# Patient Record
Sex: Female | Born: 1965
Health system: Southern US, Community
[De-identification: ages and names within clinical notes are randomized; demographics above are authoritative.]

## PROBLEM LIST (undated history)

## (undated) DIAGNOSIS — M542 Cervicalgia: Secondary | ICD-10-CM

## (undated) DIAGNOSIS — F419 Anxiety disorder, unspecified: Secondary | ICD-10-CM

## (undated) DIAGNOSIS — N809 Endometriosis, unspecified: Secondary | ICD-10-CM

## (undated) DIAGNOSIS — F32A Depression, unspecified: Secondary | ICD-10-CM

## (undated) DIAGNOSIS — R011 Cardiac murmur, unspecified: Secondary | ICD-10-CM

## (undated) HISTORY — DX: Depression, unspecified: F32.A

## (undated) HISTORY — PX: CERVICAL FUSION: SHX112

## (undated) HISTORY — PX: SURGERY OF LIP: SUR1315

## (undated) HISTORY — DX: Cardiac murmur, unspecified: R01.1

## (undated) HISTORY — DX: Anxiety disorder, unspecified: F41.9

## (undated) HISTORY — DX: Cervicalgia: M54.2

## (undated) HISTORY — DX: Endometriosis, unspecified: N80.9

---

## 2007-05-04 HISTORY — PX: PELVIC LAPAROSCOPY: SHX162

## 2011-05-04 HISTORY — PX: ABDOMINAL HYSTERECTOMY: SHX81

## 2013-05-03 HISTORY — PX: BARTHOLIN CYST MARSUPIALIZATION: SHX5383

## 2017-04-12 DIAGNOSIS — G441 Vascular headache, not elsewhere classified: Secondary | ICD-10-CM | POA: Diagnosis not present

## 2017-04-12 DIAGNOSIS — M5481 Occipital neuralgia: Secondary | ICD-10-CM | POA: Diagnosis not present

## 2017-04-12 DIAGNOSIS — R9082 White matter disease, unspecified: Secondary | ICD-10-CM | POA: Diagnosis not present

## 2017-04-12 DIAGNOSIS — Z981 Arthrodesis status: Secondary | ICD-10-CM | POA: Diagnosis not present

## 2017-04-12 DIAGNOSIS — M47812 Spondylosis without myelopathy or radiculopathy, cervical region: Secondary | ICD-10-CM | POA: Diagnosis not present

## 2017-04-12 DIAGNOSIS — R51 Headache: Secondary | ICD-10-CM | POA: Diagnosis not present

## 2017-04-12 DIAGNOSIS — G939 Disorder of brain, unspecified: Secondary | ICD-10-CM | POA: Diagnosis not present

## 2017-04-12 DIAGNOSIS — R42 Dizziness and giddiness: Secondary | ICD-10-CM | POA: Diagnosis not present

## 2017-05-02 MED FILL — GABAPENTIN 300 MG CAPSULE: 300 | 30 days supply | Qty: 120 | Fill #0

## 2017-06-10 MED FILL — GABAPENTIN 300 MG CAPSULE: 300 | 30 days supply | Qty: 120 | Fill #1

## 2017-07-05 MED FILL — ESTRADIOL 1 MG TABLET: 1 | 30 days supply | Qty: 30 | Fill #0

## 2017-07-15 MED FILL — GABAPENTIN 300 MG CAPSULE: 300 | 30 days supply | Qty: 120 | Fill #2

## 2017-08-11 ENCOUNTER — Ambulatory Visit (INDEPENDENT_AMBULATORY_CARE_PROVIDER_SITE_OTHER): Payer: 59 | Admitting: Gynecology

## 2017-08-11 ENCOUNTER — Encounter: Payer: Self-pay | Admitting: Gynecology

## 2017-08-11 VITALS — BP 120/74 | Ht 59.0 in | Wt 102.0 lb

## 2017-08-11 DIAGNOSIS — Z79890 Hormone replacement therapy (postmenopausal): Secondary | ICD-10-CM | POA: Diagnosis not present

## 2017-08-11 DIAGNOSIS — Z1321 Encounter for screening for nutritional disorder: Secondary | ICD-10-CM | POA: Diagnosis not present

## 2017-08-11 DIAGNOSIS — Z1329 Encounter for screening for other suspected endocrine disorder: Secondary | ICD-10-CM | POA: Diagnosis not present

## 2017-08-11 DIAGNOSIS — N952 Postmenopausal atrophic vaginitis: Secondary | ICD-10-CM

## 2017-08-11 DIAGNOSIS — Z13220 Encounter for screening for lipoid disorders: Secondary | ICD-10-CM

## 2017-08-11 DIAGNOSIS — Z01411 Encounter for gynecological examination (general) (routine) with abnormal findings: Secondary | ICD-10-CM

## 2017-08-11 MED ORDER — ESTRADIOL 1 MG PO TABS: 1.0000 mg | ORAL_TABLET | Freq: Every day | ORAL | 4 refills | Status: DC

## 2017-08-11 MED FILL — ESTRADIOL 1 MG TABLET: 1 | 90 days supply | Qty: 90 | Fill #0

## 2017-08-11 NOTE — Progress Notes (Signed)
    Council Mechanicracy Pulis 01-09-1966 [id number]        52 y.o.  G2P2 new patient for annual gynecologic exam.  History of endometriosis status post TAH/BSO on estradiol 1 mg daily doing well with this.  No gynecologic complaints.  Past medical history,surgical history, problem list, medications, allergies, family history and social history were all reviewed and documented as reviewed in the EPIC chart.  ROS:  Performed with pertinent positives and negatives included in the history, assessment and plan.   Additional significant findings : None   Exam: Kennon PortelaKim Gardner assistant Vitals:   08/11/17 0923  BP: 120/74  Weight: 102 lb (46.3 kg)  Height: 4\' 11"  (1.499 m)   Body mass index is 20.6 kg/m.  General appearance:  Normal affect, orientation and appearance. Skin: Grossly normal HEENT: Without gross lesions.  No cervical or supraclavicular adenopathy. Thyroid normal.  Lungs:  Clear without wheezing, rales or rhonchi Cardiac: RR, without RMG Abdominal:  Soft, nontender, without masses, guarding, rebound, organomegaly or hernia Breasts:  Examined lying and sitting without masses, retractions, discharge or axillary adenopathy. Pelvic:  Ext, BUS, Vagina: Normal with mild atrophic changes.  Adnexa: Without masses or tenderness    Anus and perineum: Normal   Rectovaginal: Normal sphincter tone without palpated masses or tenderness.    Assessment/Plan:  52 y.o. G2P2 female for annual gynecologic exam.  Status post TAH/BSO for endometriosis  1. Postmenopausal/HRT.  Is on Estrace 1 mg daily.  Has been on this since her hysterectomy.  We reviewed risks versus benefits of HRT to include the risks of thrombosis such as stroke heart attack DVT in the breast cancer issue.  Benefits to include symptom relief as well as cardiovascular and bone health.  At this point the patient wants to continue and I refilled her times 1 year. 2. Mammography overdue and I reminded patient she needs to schedule this.   Breast exam normal today. 3. Colonoscopy never.  I reminded patient she needs to schedule a baseline colonoscopy.  Second most common cancer in women.  Names and numbers provided. 4. Pap smear reported 2018.  I do not have copies of any of her Pap smears.  Pap smear of vaginal cuff done today.  No history of abnormal Pap smears.  I reviewed current screening guidelines and options of stop screening versus less frequent screening intervals reviewed.  Will readdress on annual basis. 5. DEXA never.  Will plan further into the menopause. 6. Health maintenance.  Future orders for fasting CBC, CMP, lipid profile, vitamin D and TSH placed.  Does have a history of recurrent UTIs in the past.  Check baseline urine analysis today.  Follow-up in 1 year, sooner as needed.   Dara Lordsimothy P Brinsley Wence MD, 9:47 AM 08/11/2017

## 2017-08-11 NOTE — Patient Instructions (Signed)
Call to Schedule your mammogram  [facility]. [facility], [address] Phone: [phone]   Schedule your colonoscopy   [facility]   Address: [address], [place], [place] [address]  [phone]      

## 2017-08-11 NOTE — Addendum Note (Signed)
Addended by: Dayna BarkerGARDNER, Demiya Magno K on: 08/11/2017 10:49 AM   Modules accepted: Orders

## 2017-08-12 LAB — PAP IG W/ RFLX HPV ASCU

## 2017-08-15 ENCOUNTER — Other Ambulatory Visit: Payer: Self-pay | Admitting: *Deleted

## 2017-08-15 MED ORDER — SULFAMETHOXAZOLE-TRIMETHOPRIM 800-160 MG PO TABS: 1.0000 | ORAL_TABLET | Freq: Two times a day (BID) | ORAL | 0 refills | Status: DC

## 2017-08-15 MED ORDER — METRONIDAZOLE 500 MG PO TABS: 500.0000 mg | ORAL_TABLET | Freq: Two times a day (BID) | ORAL | 0 refills | Status: DC

## 2017-08-15 MED FILL — SULFAMETHOXAZOLE-TMP DS TAB: 800-160 | 3 days supply | Qty: 6 | Fill #0

## 2017-08-15 MED FILL — metroNIDAZOLE 500 MG TABS: 500 | 7 days supply | Qty: 14 | Fill #0

## 2017-08-22 ENCOUNTER — Ambulatory Visit: Payer: 59 | Admitting: Gynecology

## 2017-08-22 ENCOUNTER — Encounter: Payer: Self-pay | Admitting: Gynecology

## 2017-08-22 ENCOUNTER — Telehealth: Payer: Self-pay | Admitting: *Deleted

## 2017-08-22 VITALS — BP 118/76

## 2017-08-22 DIAGNOSIS — N898 Other specified noninflammatory disorders of vagina: Secondary | ICD-10-CM | POA: Diagnosis not present

## 2017-08-22 DIAGNOSIS — M545 Low back pain: Secondary | ICD-10-CM

## 2017-08-22 DIAGNOSIS — Z113 Encounter for screening for infections with a predominantly sexual mode of transmission: Secondary | ICD-10-CM

## 2017-08-22 LAB — WET PREP FOR TRICH, YEAST, CLUE

## 2017-08-22 MED FILL — GABAPENTIN 300 MG CAPSULE: 300 | 30 days supply | Qty: 120 | Fill #3

## 2017-08-22 NOTE — Telephone Encounter (Signed)
Recommend clean-catch urine analysis with culture.  Her last urine culture grew out a bacteria that was resistant to a number of antibiotics she does have a UTI then we need to know which antibiotics would work best.  Low back pain can be musculoskeletal and not related to UTI.

## 2017-08-22 NOTE — Patient Instructions (Signed)
Office will call with the urine culture results.

## 2017-08-22 NOTE — Telephone Encounter (Signed)
I called pt and spoke with her about this and she didn't want to wait until the culture came back, states she would like to be treated today, transferred to front desk to schedule.

## 2017-08-22 NOTE — Telephone Encounter (Signed)
Pt was prescribed septra ds twice daily on 08/15/17 completed Rx and has lower back discomfort, no other symptoms such as frequent urination , burning with urination etc. Pt said she took a UTI Azo kit which confirmed UTI. Please advise

## 2017-08-22 NOTE — Progress Notes (Signed)
    Council Mechanicracy Myren [birth year] [id number]        52 y.o.  G2P2 presents having recently been seen for her annual exam where her urine grew out E. coli and she was treated with Septra DS 1 p.o. twice daily times 3 days.  Her Pap smear showed normal cytology but did show trichomonas.  She was asymptomatic at the time.  She notes over the last several days she has developed some low back pain and wonders whether she is having recurrence of her UTI.  No frequency dysuria urgency or chills.  Also notes a day or 2 of slight vaginal discharge.  No odor itching or irritation.  Past medical history,surgical history, problem list, medications, allergies, family history and social history were all reviewed and documented in the EPIC chart.  Directed ROS with pertinent positives and negatives documented in the history of present illness/assessment and plan.  Exam: Kennon PortelaKim Gardner assistant Vitals:   08/22/17 1536  BP: 118/76   General appearance:  Normal Spine straight without CVA tenderness Abdomen soft nontender without masses guarding rebound Pelvic external BUS vagina with yellow to whitish discharge.  Bimanual without masses or tenderness.  Assessment/Plan:  52 y.o. G2P2 with above history and exam.  Urine analysis shows no white cells or red cells.  10-20 squamous cells moderate bacteria.  I discussed the contaminant nature of the urine analysis and will await culture before treating given her isolated symptoms of low back pain.  I also reviewed the trichomonas diagnoses on Pap smear.  She relates STDs would not be possible.  She took Flagyl 500 mg twice daily and is just finishing the course of antibiotics.  Wet prep today is negative.  I did a GC/Chlamydia screen for completeness with her acknowledgment.  Dara Lordsimothy P Hicks Feick MD, 3:45 PM 08/22/2017

## 2017-08-23 LAB — C. TRACHOMATIS/N. GONORRHOEAE RNA
C. trachomatis RNA, TMA: NOT DETECTED
N. gonorrhoeae RNA, TMA: NOT DETECTED

## 2017-08-24 LAB — URINALYSIS, COMPLETE W/RFL CULTURE
Bilirubin Urine: NEGATIVE
Glucose, UA: NEGATIVE
Hyaline Cast: NONE SEEN /LPF
Leukocyte Esterase: NEGATIVE
Nitrites, Initial: NEGATIVE
Protein, ur: NEGATIVE
Specific Gravity, Urine: 1.025 (ref 1.001–1.03)
pH: 5.5 (ref 5.0–8.0)

## 2017-08-24 LAB — CULTURE INDICATED

## 2017-08-24 LAB — URINE CULTURE
MICRO NUMBER:: 90494682
SPECIMEN QUALITY:: ADEQUATE

## 2017-09-05 ENCOUNTER — Encounter: Payer: Self-pay | Admitting: Gynecology

## 2017-09-05 LAB — URINALYSIS, COMPLETE W/RFL CULTURE
Bilirubin Urine: NEGATIVE
Glucose, UA: NEGATIVE
Hgb urine dipstick: NEGATIVE
Hyaline Cast: NONE SEEN /LPF
Ketones, ur: NEGATIVE
Nitrites, Initial: POSITIVE — AB
Protein, ur: NEGATIVE
Specific Gravity, Urine: 1.021 (ref 1.001–1.03)
pH: 6 (ref 5.0–8.0)

## 2017-09-05 LAB — URINE CULTURE
MICRO NUMBER:: 90447987
SPECIMEN QUALITY:: ADEQUATE

## 2017-09-05 LAB — CULTURE INDICATED

## 2017-10-03 MED FILL — GABAPENTIN 300 MG CAPSULE: 300 | 30 days supply | Qty: 120 | Fill #0

## 2017-11-08 MED FILL — ESTRADIOL 1 MG TABLET: 1 | 90 days supply | Qty: 90 | Fill #1

## 2017-11-08 MED FILL — GABAPENTIN 300 MG CAPSULE: 300 | 30 days supply | Qty: 120 | Fill #0

## 2017-12-15 MED FILL — GABAPENTIN 300 MG CAPSULE: 300 | 30 days supply | Qty: 120 | Fill #1

## 2018-01-24 MED FILL — GABAPENTIN 300 MG CAPSULE: 300 | 30 days supply | Qty: 120 | Fill #2

## 2018-02-08 MED FILL — ESTRADIOL 1 MG TABLET: 1 | 90 days supply | Qty: 90 | Fill #2

## 2018-03-02 MED FILL — GABAPENTIN 300 MG CAPSULE: 300 | 30 days supply | Qty: 120 | Fill #3

## 2018-04-07 MED FILL — GABAPENTIN 300 MG CAPSULE: 300 | 30 days supply | Qty: 120 | Fill #4

## 2018-04-10 DIAGNOSIS — M5481 Occipital neuralgia: Secondary | ICD-10-CM | POA: Diagnosis not present

## 2018-04-10 DIAGNOSIS — G939 Disorder of brain, unspecified: Secondary | ICD-10-CM | POA: Diagnosis not present

## 2018-04-10 DIAGNOSIS — G441 Vascular headache, not elsewhere classified: Secondary | ICD-10-CM | POA: Diagnosis not present

## 2018-04-11 ENCOUNTER — Other Ambulatory Visit: Payer: Self-pay | Admitting: Family

## 2018-04-11 DIAGNOSIS — R222 Localized swelling, mass and lump, trunk: Secondary | ICD-10-CM

## 2018-04-12 ENCOUNTER — Other Ambulatory Visit (HOSPITAL_COMMUNITY): Payer: Self-pay | Admitting: Neurology

## 2018-04-12 DIAGNOSIS — M5481 Occipital neuralgia: Secondary | ICD-10-CM

## 2018-04-24 ENCOUNTER — Ambulatory Visit (HOSPITAL_COMMUNITY)
Admission: RE | Admit: 2018-04-24 | Discharge: 2018-04-24 | Disposition: A | Discharge status: Home / Self Care | Payer: 59 | Source: Ambulatory Visit | Attending: Neurology | Admitting: Neurology

## 2018-04-24 DIAGNOSIS — M47813 Spondylosis without myelopathy or radiculopathy, cervicothoracic region: Secondary | ICD-10-CM | POA: Diagnosis not present

## 2018-04-24 DIAGNOSIS — M5481 Occipital neuralgia: Secondary | ICD-10-CM | POA: Insufficient documentation

## 2018-04-24 DIAGNOSIS — M47892 Other spondylosis, cervical region: Secondary | ICD-10-CM | POA: Insufficient documentation

## 2018-04-24 DIAGNOSIS — M47893 Other spondylosis, cervicothoracic region: Secondary | ICD-10-CM | POA: Insufficient documentation

## 2018-05-10 MED FILL — GABAPENTIN 300 MG CAPSULE: 300 | 30 days supply | Qty: 120 | Fill #5

## 2018-05-10 MED FILL — ESTRADIOL 1 MG TABLET: 1 | 90 days supply | Qty: 90 | Fill #3

## 2018-06-15 MED FILL — GABAPENTIN 300 MG CAPSULE: 300 | 30 days supply | Qty: 180 | Fill #0

## 2018-07-21 MED FILL — GABAPENTIN 300 MG CAPSULE: 300 | 30 days supply | Qty: 180 | Fill #1

## 2018-07-21 MED FILL — ESTRADIOL 1 MG TABS: 1 | 90 days supply | Qty: 90 | Fill #4

## 2018-08-10 ENCOUNTER — Other Ambulatory Visit: Payer: Self-pay

## 2018-08-15 ENCOUNTER — Ambulatory Visit (INDEPENDENT_AMBULATORY_CARE_PROVIDER_SITE_OTHER): Payer: 59 | Admitting: Gynecology

## 2018-08-15 ENCOUNTER — Other Ambulatory Visit: Payer: Self-pay

## 2018-08-15 ENCOUNTER — Encounter: Payer: Self-pay | Admitting: Gynecology

## 2018-08-15 VITALS — BP 124/76 | Ht 59.0 in | Wt 109.0 lb

## 2018-08-15 DIAGNOSIS — Z79890 Hormone replacement therapy (postmenopausal): Secondary | ICD-10-CM

## 2018-08-15 DIAGNOSIS — Z1329 Encounter for screening for other suspected endocrine disorder: Secondary | ICD-10-CM

## 2018-08-15 DIAGNOSIS — Z1321 Encounter for screening for nutritional disorder: Secondary | ICD-10-CM

## 2018-08-15 DIAGNOSIS — Z13220 Encounter for screening for lipoid disorders: Secondary | ICD-10-CM

## 2018-08-15 DIAGNOSIS — Z01419 Encounter for gynecological examination (general) (routine) without abnormal findings: Secondary | ICD-10-CM | POA: Diagnosis not present

## 2018-08-15 MED ORDER — ESTRADIOL 1 MG PO TABS: 1.0000 mg | ORAL_TABLET | Freq: Every day | ORAL | 4 refills | Status: DC

## 2018-08-15 NOTE — Progress Notes (Signed)
    Tonya Ramirez [birth year] [id number]        53 y.o.  G2P2 for annual gynecologic exam.  Without gynecologic complaints  Past medical history,surgical history, problem list, medications, allergies, family history and social history were all reviewed and documented as reviewed in the EPIC chart.  ROS:  Performed with pertinent positives and negatives included in the history, assessment and plan.   Additional significant findings : None   Exam: Kennon Portela assistant Vitals:   08/15/18 0914  BP: 124/76  Weight: 109 lb (49.4 kg)  Height: 4\' 11"  (1.499 m)   Body mass index is 22.02 kg/m.  General appearance:  Normal affect, orientation and appearance. Skin: Grossly normal HEENT: Without gross lesions.  No cervical or supraclavicular adenopathy. Thyroid normal.  Lungs:  Clear without wheezing, rales or rhonchi Cardiac: RR, without RMG Abdominal:  Soft, nontender, without masses, guarding, rebound, organomegaly or hernia Breasts:  Examined lying and sitting without masses, retractions, discharge or axillary adenopathy. Pelvic:  Ext, BUS, Vagina: Normal with mild atrophic changes  Adnexa: Without masses or tenderness    Anus and perineum: Normal   Rectovaginal: Normal sphincter tone without palpated masses or tenderness.    Assessment/Plan:  53 y.o. G2P2 female for annual gynecologic exam.  That is post TAH/BSO for endometriosis  1. HRT.  Continues on estradiol 1 mg daily.  Doing well with this and wants to continue.  We again reviewed the risks to include thrombosis and possible breast cancer issues.  Refill x1 year provided. 2. Mammography overdue and I reminded her to schedule this when the coronavirus restrictions are lifted.  Breast exam normal today. 3. Colonoscopy never.  I recommended screening colonoscopy after the coronavirus restrictions are lifted and she agrees to arrange. 4. Pap smear 2019.  No Pap smear done today.  No history of abnormal Pap smears.  We again  discussed options to stop screening per current screening guidelines based on hysterectomy history. 5. DEXA never.  Will plan further into the menopause. 6. Health maintenance.  CBC, CMP, lipid profile, vitamin D and TSH ordered today.  Follow-up 1 year, sooner as needed   Dara Lords MD, 9:51 AM 08/15/2018

## 2018-08-15 NOTE — Patient Instructions (Signed)
Follow-up in 1 year for annual exam.  Schedule your mammogram when you are able.  Schedule your screening colonoscopy.

## 2018-08-16 ENCOUNTER — Other Ambulatory Visit: Payer: Self-pay | Admitting: *Deleted

## 2018-08-16 DIAGNOSIS — E789 Disorder of lipoprotein metabolism, unspecified: Secondary | ICD-10-CM

## 2018-08-16 DIAGNOSIS — E875 Hyperkalemia: Secondary | ICD-10-CM

## 2018-08-16 LAB — COMPREHENSIVE METABOLIC PANEL
AG Ratio: 1.6 (calc) (ref 1.0–2.5)
ALT: 13 U/L (ref 6–29)
AST: 16 U/L (ref 10–35)
Albumin: 4 g/dL (ref 3.6–5.1)
Alkaline phosphatase (APISO): 67 U/L (ref 37–153)
BUN: 19 mg/dL (ref 7–25)
CO2: 31 mmol/L (ref 20–32)
Calcium: 8.8 mg/dL (ref 8.6–10.4)
Chloride: 104 mmol/L (ref 98–110)
Creat: 0.78 mg/dL (ref 0.50–1.05)
Globulin: 2.5 g/dL (calc) (ref 1.9–3.7)
Glucose, Bld: 98 mg/dL (ref 65–99)
Potassium: 3.3 mmol/L — ABNORMAL LOW (ref 3.5–5.3)
Sodium: 141 mmol/L (ref 135–146)
Total Bilirubin: 0.3 mg/dL (ref 0.2–1.2)
Total Protein: 6.5 g/dL (ref 6.1–8.1)

## 2018-08-16 LAB — CBC WITH DIFFERENTIAL/PLATELET
Absolute Monocytes: 270 cells/uL (ref 200–950)
Basophils Absolute: 42 cells/uL (ref 0–200)
Basophils Relative: 0.8 %
Eosinophils Absolute: 198 cells/uL (ref 15–500)
Eosinophils Relative: 3.8 %
HCT: 36 % (ref 35.0–45.0)
Hemoglobin: 12.2 g/dL (ref 11.7–15.5)
Lymphs Abs: 1732 cells/uL (ref 850–3900)
MCH: 29.5 pg (ref 27.0–33.0)
MCHC: 33.9 g/dL (ref 32.0–36.0)
MCV: 87 fL (ref 80.0–100.0)
MPV: 10.9 fL (ref 7.5–12.5)
Monocytes Relative: 5.2 %
Neutro Abs: 2959 cells/uL (ref 1500–7800)
Neutrophils Relative %: 56.9 %
Platelets: 271 10*3/uL (ref 140–400)
RBC: 4.14 10*6/uL (ref 3.80–5.10)
RDW: 12.2 % (ref 11.0–15.0)
Total Lymphocyte: 33.3 %
WBC: 5.2 10*3/uL (ref 3.8–10.8)

## 2018-08-16 LAB — LIPID PANEL
Cholesterol: 185 mg/dL (ref <200)
HDL: 60 mg/dL (ref >=50)
LDL Cholesterol (Calc): 90 mg/dL (calc)
Non-HDL Cholesterol (Calc): 125 mg/dL (calc) (ref <130)
Total CHOL/HDL Ratio: 3.1 (calc) (ref <5.0)
Triglycerides: 264 mg/dL — ABNORMAL HIGH (ref <150)

## 2018-08-16 LAB — VITAMIN D 25 HYDROXY (VIT D DEFICIENCY, FRACTURES): Vit D, 25-Hydroxy: 38 ng/mL (ref 30–100)

## 2018-08-16 LAB — TSH: TSH: 3.5 mIU/L

## 2018-08-17 ENCOUNTER — Other Ambulatory Visit: Payer: 59

## 2018-08-17 ENCOUNTER — Other Ambulatory Visit: Payer: Self-pay

## 2018-08-17 DIAGNOSIS — E789 Disorder of lipoprotein metabolism, unspecified: Secondary | ICD-10-CM | POA: Diagnosis not present

## 2018-08-17 DIAGNOSIS — E875 Hyperkalemia: Secondary | ICD-10-CM | POA: Diagnosis not present

## 2018-08-17 LAB — LIPID PANEL
Cholesterol: 192 mg/dL (ref <200)
HDL: 61 mg/dL (ref >=50)
LDL Cholesterol (Calc): 95 mg/dL (calc)
Non-HDL Cholesterol (Calc): 131 mg/dL (calc) — ABNORMAL HIGH (ref <130)
Total CHOL/HDL Ratio: 3.1 (calc) (ref <5.0)
Triglycerides: 237 mg/dL — ABNORMAL HIGH (ref <150)

## 2018-08-17 LAB — ELECTROLYTE PANEL
CO2: 31 mmol/L (ref 20–32)
Chloride: 102 mmol/L (ref 98–110)
Potassium: 3.6 mmol/L (ref 3.5–5.3)
Sodium: 139 mmol/L (ref 135–146)

## 2018-08-18 ENCOUNTER — Telehealth: Payer: Self-pay | Admitting: Gynecology

## 2018-08-18 DIAGNOSIS — E7889 Other lipoprotein metabolism disorders: Secondary | ICD-10-CM

## 2018-08-18 NOTE — Telephone Encounter (Signed)
Not that I am aware of with gabapentin.  I would recommend then if she is concerned to repeat a fasting lipid profile in 3 to 6 months.  We can recheck the triglycerides at that time.

## 2018-08-21 NOTE — Telephone Encounter (Signed)
Left detailed message on cell per DPR access, order placed, informed to schedule lab appointment in 3-6 months.

## 2018-10-03 MED FILL — GABAPENTIN 300 MG CAPSULE: 300 | 30 days supply | Qty: 180 | Fill #2

## 2018-11-13 MED FILL — ESTRADIOL 1 MG TABS: 1 | 90 days supply | Qty: 90 | Fill #0

## 2018-11-13 MED FILL — GABAPENTIN 300 MG CAPSULE: 300 | 30 days supply | Qty: 180 | Fill #3

## 2019-01-24 ENCOUNTER — Encounter: Payer: Self-pay | Admitting: Gynecology

## 2019-02-02 MED FILL — GABAPENTIN 300 MG CAPSULE: 300 | 30 days supply | Qty: 180 | Fill #4

## 2019-02-02 MED FILL — ESTRADIOL 1 MG TABS: 1 | 90 days supply | Qty: 90 | Fill #1

## 2019-02-18 ENCOUNTER — Encounter: Payer: Self-pay | Admitting: Gynecology

## 2019-03-28 MED FILL — GABAPENTIN 300 MG CAPSULE: 300 | 30 days supply | Qty: 180 | Fill #5

## 2019-04-23 DIAGNOSIS — G441 Vascular headache, not elsewhere classified: Secondary | ICD-10-CM | POA: Diagnosis not present

## 2019-04-23 DIAGNOSIS — M542 Cervicalgia: Secondary | ICD-10-CM | POA: Diagnosis not present

## 2019-04-23 DIAGNOSIS — G939 Disorder of brain, unspecified: Secondary | ICD-10-CM | POA: Diagnosis not present

## 2019-04-23 DIAGNOSIS — M5481 Occipital neuralgia: Secondary | ICD-10-CM | POA: Diagnosis not present

## 2019-04-23 MED FILL — GABAPENTIN 300 MG CAPSULE: 300 | 30 days supply | Qty: 120 | Fill #0

## 2019-05-25 MED FILL — GABAPENTIN 300 MG CAPSULE: 300 | 30 days supply | Qty: 120 | Fill #0

## 2019-05-25 MED FILL — ESTRADIOL 1 MG TABS: 1 | 90 days supply | Qty: 90 | Fill #2

## 2019-07-04 MED FILL — GABAPENTIN 300 MG CAPSULE: 300 | 30 days supply | Qty: 120 | Fill #1

## 2019-07-23 ENCOUNTER — Encounter: Payer: Self-pay | Admitting: Obstetrics and Gynecology

## 2019-07-23 ENCOUNTER — Other Ambulatory Visit: Payer: Self-pay

## 2019-07-23 DIAGNOSIS — E781 Pure hyperglyceridemia: Secondary | ICD-10-CM

## 2019-07-23 NOTE — Telephone Encounter (Signed)
Does JK have any annual visits open for this Thursday?

## 2019-07-24 ENCOUNTER — Encounter: Payer: Self-pay | Admitting: Family Medicine

## 2019-07-24 ENCOUNTER — Ambulatory Visit: Payer: 59 | Admitting: Family Medicine

## 2019-07-24 ENCOUNTER — Other Ambulatory Visit: Payer: Self-pay

## 2019-07-24 VITALS — BP 152/100 | HR 71 | Temp 96.5°F | Ht 60.0 in | Wt 99.5 lb

## 2019-07-24 DIAGNOSIS — Z566 Other physical and mental strain related to work: Secondary | ICD-10-CM | POA: Insufficient documentation

## 2019-07-24 DIAGNOSIS — I10 Essential (primary) hypertension: Secondary | ICD-10-CM | POA: Insufficient documentation

## 2019-07-24 MED ORDER — AMLODIPINE BESYLATE 5 MG PO TABS: 5.0000 mg | ORAL_TABLET | Freq: Every day | ORAL | 3 refills | Status: DC

## 2019-07-24 MED FILL — AMLODIPINE BESYLATE 5 MG TA: 5 | 90 days supply | Qty: 90 | Fill #0

## 2019-07-24 NOTE — Patient Instructions (Signed)
Keep the diet clean and stay active.  Keep checking your blood pressures at home.   Please consider counseling. Contact [phone] to schedule an appointment or inquire about cost/insurance coverage.  Coping skills Choose 5 that work for you:  Take a deep breath  Count to 20  Read a book  Do a puzzle  Meditate  Bake  Sing  Knit  Garden  Pray  Go outside  Call a friend  Listen to music  Take a walk  Color  Send a note  Take a bath  Watch a movie  Be alone in a quiet place  Pet an animal  Visit a friend  Journal  Exercise  Stretch   Let us know if you need anything.

## 2019-07-24 NOTE — Progress Notes (Signed)
Chief Complaint  Patient presents with  . New Patient (Initial Visit)    Blood Pressure concerns       New Patient Visit SUBJECTIVE: HPI: Tonya Ramirez is an 54 y.o.female who is being seen for establishing care.  With the past 3 weeks, the patient is been checking her blood pressure and has been quite a bit higher.  She has been having intermittent palpitations.  No chest pain or shortness of breath.  She does not have a history of heart disease and does not smoke.  She has been having increased stress at work.  She wonders if this could be contributing.  She is not taking any medication for stress, anxiety, or depression.  She does have a family history of high blood pressure.  Her father did very well with amlodipine.  She does not remember what her mother was on.  Past Medical History:  Diagnosis Date  . Endometriosis   . Heart murmur   . Neck pain    Past Surgical History:  Procedure Laterality Date  . ABDOMINAL HYSTERECTOMY  2013   TAH BSO  . BARTHOLIN CYST MARSUPIALIZATION  2015  . CERVICAL FUSION    . CESAREAN SECTION    . PELVIC LAPAROSCOPY  2009   Endometriosis  . SURGERY OF LIP     X3   Family History  Problem Relation Age of Onset  . COPD Mother   . Hypertension Mother   . Hypertension Father   . Diabetes Father   . Stroke Father    No Known Allergies  Current Outpatient Medications:  .  estradiol (ESTRACE) 1 MG tablet, Take 1 tablet (1 mg total) by mouth daily., Disp: 90 tablet, Rfl: 4 .  gabapentin (NEURONTIN) 300 MG capsule, Take 300 mg by mouth 4 (four) times daily., Disp: , Rfl:  .  Multiple Vitamin (MULTIVITAMIN) tablet, Take 1 tablet by mouth daily., Disp: , Rfl:  .  Omega-3 Fatty Acids (FISH OIL) 1000 MG CAPS, Take 2 capsules by mouth daily., Disp: , Rfl:  .  amLODipine (NORVASC) 5 MG tablet, Take 1 tablet (5 mg total) by mouth daily., Disp: 30 tablet, Rfl: 3  OBJECTIVE: BP (!) 152/100 (BP Location: Left Arm, Patient Position: Sitting, Cuff Size:  Normal)   Pulse 71   Temp (!) 96.5 F (35.8 C) (Temporal)   Ht 5' (1.524 m)   Wt 99 lb 8 oz (45.1 kg)   SpO2 98%   BMI 19.43 kg/m  General:  well developed, well nourished, in no apparent distress Skin:  no significant moles, warts, or growths Throat/Pharynx:  lips and gingiva without lesion; tongue and uvula midline; non-inflamed pharynx; no exudates or postnasal drainage Lungs:  clear to auscultation, breath sounds equal bilaterally, no respiratory distress Cardio:  regular rate and rhythm, no LE edema or bruits Musculoskeletal:  symmetrical muscle groups noted without atrophy or deformity Neuro:  gait normal Psych: well oriented with normal range of affect and appropriate judgment/insight  ASSESSMENT/PLAN: Hypertension, unspecified type - Plan: amLODipine (NORVASC) 5 MG tablet  Stress at work  1-counseled on diet and exercise.  Start amlodipine.  Continue to check blood pressures at home. 2-counseling information provided.  Need for the rest of the week off.  I suspect her job is a main stressor for her. Patient should return for EKG at convenience.  I will see her in 4 weeks to recheck her blood pressure. The patient voiced understanding and agreement to the plan.   [place], DO  07/24/19  4:08 PM

## 2019-07-25 ENCOUNTER — Ambulatory Visit (INDEPENDENT_AMBULATORY_CARE_PROVIDER_SITE_OTHER): Payer: 59

## 2019-07-25 ENCOUNTER — Other Ambulatory Visit: Payer: Self-pay

## 2019-07-25 ENCOUNTER — Encounter: Payer: Self-pay | Admitting: Family Medicine

## 2019-07-25 DIAGNOSIS — R002 Palpitations: Secondary | ICD-10-CM

## 2019-07-25 NOTE — Progress Notes (Signed)
Patient came in today to have EKG for palpitations. PCP reported results normal

## 2019-07-26 ENCOUNTER — Encounter: Payer: Self-pay | Admitting: Family Medicine

## 2019-07-26 ENCOUNTER — Other Ambulatory Visit: Payer: 59

## 2019-07-26 DIAGNOSIS — E781 Pure hyperglyceridemia: Secondary | ICD-10-CM

## 2019-07-26 LAB — LIPID PANEL
Cholesterol: 202 mg/dL — ABNORMAL HIGH (ref <200)
HDL: 69 mg/dL (ref >=50)
LDL Cholesterol (Calc): 107 mg/dL (calc) — ABNORMAL HIGH
Non-HDL Cholesterol (Calc): 133 mg/dL (calc) — ABNORMAL HIGH (ref <130)
Total CHOL/HDL Ratio: 2.9 (calc) (ref <5.0)
Triglycerides: 148 mg/dL (ref <150)

## 2019-07-30 ENCOUNTER — Encounter: Payer: Self-pay | Admitting: Obstetrics and Gynecology

## 2019-07-31 NOTE — Telephone Encounter (Signed)
Dr.Kendall I called and discussed recent lipid profile results with patient yesterday. This is her follow up from that call.

## 2019-07-31 NOTE — Telephone Encounter (Signed)
Gabapentin is not a medication that is typically prescribed in OB/GYN, and not a medication that I prescribed, it sounds vaguely familiar that its side effect can include effect on lipid profile, but I would recommend she talk to her prescriber regarding side effects.

## 2019-08-01 ENCOUNTER — Encounter: Payer: Self-pay | Admitting: *Deleted

## 2019-08-01 MED FILL — GABAPENTIN 300 MG CAPSULE: 300 | 30 days supply | Qty: 120 | Fill #2

## 2019-08-13 ENCOUNTER — Encounter: Payer: Self-pay | Admitting: Obstetrics and Gynecology

## 2019-08-14 ENCOUNTER — Other Ambulatory Visit: Payer: Self-pay

## 2019-08-14 MED ORDER — ESTRADIOL 1 MG PO TABS: 1.0000 mg | ORAL_TABLET | Freq: Every day | ORAL | 0 refills | Status: DC

## 2019-08-14 MED FILL — ESTRADIOL 1 MG TABS: 1 | 90 days supply | Qty: 90 | Fill #0

## 2019-08-16 ENCOUNTER — Encounter: Payer: 59 | Admitting: Obstetrics and Gynecology

## 2019-08-20 NOTE — Telephone Encounter (Signed)
It appears patient is wanting to schedule her annual exam

## 2019-08-21 ENCOUNTER — Ambulatory Visit: Payer: 59 | Admitting: Family Medicine

## 2019-09-26 ENCOUNTER — Ambulatory Visit: Payer: 59 | Admitting: Family Medicine

## 2019-11-23 MED FILL — GABAPENTIN 300 MG CAPSULE: 300 | 30 days supply | Qty: 120 | Fill #4

## 2019-12-24 ENCOUNTER — Other Ambulatory Visit: Payer: Self-pay | Admitting: Obstetrics and Gynecology

## 2019-12-25 MED ORDER — ESTRADIOL 1 MG PO TABS: 1.0000 mg | ORAL_TABLET | Freq: Every day | ORAL | 0 refills | Status: DC

## 2019-12-25 MED FILL — ESTRADIOL 1 MG TABS: 1 | 90 days supply | Qty: 90 | Fill #0

## 2019-12-25 NOTE — Telephone Encounter (Signed)
Patient wrote "Patient comment: I am currently out of work and as soon as I habe a job i will make an appointment.  Please refill for 90 days. I am out. Thank you. "  Overdue for CE. Last one was 08/05/18 with TF. Please advise.

## 2020-01-15 MED FILL — GABAPENTIN 300 MG CAPSULE: 300 | 30 days supply | Qty: 120 | Fill #5

## 2020-02-14 NOTE — Telephone Encounter (Signed)
Pt dropped off documents to be filled out by provider ( [employer] - 2 pages) for her new job, pt stated needing document by 02-15-2020 for her to start her new job by Monday. Pt would like to pick up document when ready, pt is going to make arrangement for a visit tomorrow with provider.  Document at front office tray under providers name.

## 2020-02-15 ENCOUNTER — Other Ambulatory Visit: Payer: Self-pay | Admitting: Family Medicine

## 2020-02-15 ENCOUNTER — Other Ambulatory Visit: Payer: Self-pay

## 2020-02-15 ENCOUNTER — Encounter: Payer: Self-pay | Admitting: Family Medicine

## 2020-02-15 ENCOUNTER — Telehealth (INDEPENDENT_AMBULATORY_CARE_PROVIDER_SITE_OTHER): Payer: Self-pay | Admitting: Family Medicine

## 2020-02-15 VITALS — BP 128/80 | HR 71 | Temp 97.9°F | Ht 60.0 in | Wt 92.0 lb

## 2020-02-15 DIAGNOSIS — J0101 Acute recurrent maxillary sinusitis: Secondary | ICD-10-CM | POA: Insufficient documentation

## 2020-02-15 DIAGNOSIS — Z2821 Immunization not carried out because of patient refusal: Secondary | ICD-10-CM

## 2020-02-15 MED ORDER — AMOXICILLIN-POT CLAVULANATE 875-125 MG PO TABS: 1.0000 | ORAL_TABLET | Freq: Two times a day (BID) | ORAL | 0 refills | Status: DC

## 2020-02-15 MED ORDER — PREDNISONE 20 MG PO TABS: 40.0000 mg | ORAL_TABLET | Freq: Every day | ORAL | 0 refills | Status: DC

## 2020-02-15 MED FILL — predniSONE 20 MG TABS: 20 | 5 days supply | Qty: 10 | Fill #0

## 2020-02-15 MED FILL — AMOX-CLAV 875-125 MG TABLET: 875-125 | 10 days supply | Qty: 20 | Fill #0
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
## 2020-03-04 ENCOUNTER — Other Ambulatory Visit (HOSPITAL_BASED_OUTPATIENT_CLINIC_OR_DEPARTMENT_OTHER): Payer: Self-pay | Admitting: Neurology

## 2020-03-04 MED FILL — GABAPENTIN 300 MG CAPSULE: 300 | 30 days supply | Qty: 120 | Fill #0

## 2020-04-07 ENCOUNTER — Encounter: Payer: Self-pay | Admitting: *Deleted

## 2020-04-07 ENCOUNTER — Other Ambulatory Visit (HOSPITAL_BASED_OUTPATIENT_CLINIC_OR_DEPARTMENT_OTHER): Payer: Self-pay | Admitting: Neurology

## 2020-04-07 ENCOUNTER — Other Ambulatory Visit: Payer: Self-pay | Admitting: Obstetrics and Gynecology

## 2020-04-07 MED ORDER — ESTRADIOL 1 MG PO TABS: 1.0000 mg | ORAL_TABLET | Freq: Every day | ORAL | 0 refills | Status: DC

## 2020-04-07 MED FILL — ESTRADIOL 1 MG TABS: 1 | 90 days supply | Qty: 90 | Fill #0

## 2020-04-07 MED FILL — GABAPENTIN 300 MG CAPSULE: 300 | 30 days supply | Qty: 120 | Fill #0

## 2020-04-07 NOTE — Telephone Encounter (Signed)
Tonya Ramirez this is the patient I sent a staff message you about scheduling annual exam. Can you help her schedule via my chart.

## 2020-05-29 ENCOUNTER — Telehealth (INDEPENDENT_AMBULATORY_CARE_PROVIDER_SITE_OTHER): Payer: BC Managed Care – PPO | Admitting: Family Medicine

## 2020-05-29 ENCOUNTER — Other Ambulatory Visit: Payer: Self-pay

## 2020-05-29 ENCOUNTER — Encounter: Payer: Self-pay | Admitting: Family Medicine

## 2020-05-29 ENCOUNTER — Other Ambulatory Visit: Payer: Self-pay | Admitting: Family Medicine

## 2020-05-29 DIAGNOSIS — J0101 Acute recurrent maxillary sinusitis: Secondary | ICD-10-CM | POA: Diagnosis not present

## 2020-05-29 MED ORDER — AMOXICILLIN-POT CLAVULANATE 875-125 MG PO TABS: 1.0000 | ORAL_TABLET | Freq: Two times a day (BID) | ORAL | 0 refills | Status: DC

## 2020-05-29 MED FILL — AMOX-CLAV 875-125 MG TABLET: 875-125 | 10 days supply | Qty: 20 | Fill #0

## 2020-05-29 NOTE — Progress Notes (Signed)
[facility] [address] [place], [place] [address] [phone]  Date:  05/29/2020   Name:  Tonya Ramirez   DOB:  05-30-1965   MRN:  [id number]  PCP:  Sharlene Dory, DO    Chief Complaint: No chief complaint on file.   History of Present Illness:  Tonya Ramirez is a 55 y.o. very pleasant female patient who presents with the following:  Virtual visit today for concern of illness Patient location is home, provider location is home.  Patient identity confirmed with 2 factors, she gives consent for virtual visit today. The patient myself are present on the call  History of hypertension, recurrent sinusitis She notes a history of recurrent sinusitis, she has typical symptoms at this time She notes onset of illness about one week ago Yesterday especially she noted sinus pain- frontal She also has some tooth pain She is using claritin D, alavert- other OTC meds- not helping   She was having chills and aches last night as well  No vomiting or diarrhea Mild cough  NKDA  She took a covid test 2 days - PCR - and it was negative     Patient Active Problem List   Diagnosis Date Noted  . Acute recurrent maxillary sinusitis 02/15/2020  . Stress at work 07/24/2019  . Hypertension 07/24/2019    Past Medical History:  Diagnosis Date  . Endometriosis   . Heart murmur   . Neck pain     Past Surgical History:  Procedure Laterality Date  . ABDOMINAL HYSTERECTOMY  2013   TAH BSO  . BARTHOLIN CYST MARSUPIALIZATION  2015  . CERVICAL FUSION    . CESAREAN SECTION    . PELVIC LAPAROSCOPY  2009   Endometriosis  . SURGERY OF LIP     X3    Social History   Tobacco Use  . Smoking status: Never Smoker  . Smokeless tobacco: Never Used  Vaping Use  . Vaping Use: Never used  Substance Use Topics  . Alcohol use: Never  . Drug use: Never    Family History  Problem Relation Age of Onset  . COPD Mother   .  Hypertension Mother   . Hypertension Father   . Diabetes Father   . Stroke Father     No Known Allergies  Medication list has been reviewed and updated.  Current Outpatient Medications on File Prior to Visit  Medication Sig Dispense Refill  . amLODipine (NORVASC) 5 MG tablet Take 1 tablet (5 mg total) by mouth daily. 30 tablet 3  . amoxicillin-clavulanate (AUGMENTIN) 875-125 MG tablet Take 1 tablet by mouth 2 (two) times daily. 20 tablet 0  . cyanocobalamin 100 MCG tablet Take 100 mcg by mouth daily.    Marland Kitchen estradiol (ESTRACE) 1 MG tablet Take 1 tablet (1 mg total) by mouth daily. 90 tablet 0  . gabapentin (NEURONTIN) 300 MG capsule Take 300 mg by mouth 4 (four) times daily.    . Multiple Vitamin (MULTIVITAMIN) tablet Take 1 tablet by mouth daily.    . Omega-3 Fatty Acids (FISH OIL) 1000 MG CAPS Take 2 capsules by mouth daily.     No current facility-administered medications on file prior to visit.    Review of Systems:  As per HPI- otherwise negative.   Physical Examination: Vitals:   05/29/20 1143  BP: (!) 143/87  Pulse: 81  Temp: 99.9 F (37.7 C)   There were no vitals  filed for this visit. There is no height or weight on file to calculate BMI. Ideal Body Weight:   Pt observed over video She looks well, no distress-no shortness of breath or wheezing is noted She indicates tenderness over her bilateral   Assessment and Plan: Acute recurrent maxillary sinusitis - Plan: amoxicillin-clavulanate (AUGMENTIN) 875-125 MG tablet  Video used for duration of visit today. Patient with concern of recurrent sinusitis. She notes success with Augmentin in the past. She recently took a COVID-19 PCR which was negative. I have sent in a prescription for Augmentin 875 twice daily for 10 days. I have asked patient to let us know if she is not feeling better within the next 2 or 3 days, sooner if getting worse Use antipyretics as needed  Signed Abbe Amsterdam, MD

## 2020-06-06 ENCOUNTER — Other Ambulatory Visit: Payer: Self-pay

## 2020-06-06 ENCOUNTER — Ambulatory Visit (INDEPENDENT_AMBULATORY_CARE_PROVIDER_SITE_OTHER): Payer: BC Managed Care – PPO | Admitting: Obstetrics and Gynecology

## 2020-06-06 ENCOUNTER — Encounter: Payer: Self-pay | Admitting: Obstetrics and Gynecology

## 2020-06-06 VITALS — BP 122/80 | Ht 59.0 in | Wt 93.0 lb

## 2020-06-06 DIAGNOSIS — Z01419 Encounter for gynecological examination (general) (routine) without abnormal findings: Secondary | ICD-10-CM | POA: Diagnosis not present

## 2020-06-06 DIAGNOSIS — Z13220 Encounter for screening for lipoid disorders: Secondary | ICD-10-CM

## 2020-06-06 DIAGNOSIS — Z1321 Encounter for screening for nutritional disorder: Secondary | ICD-10-CM | POA: Diagnosis not present

## 2020-06-06 DIAGNOSIS — Z79890 Hormone replacement therapy (postmenopausal): Secondary | ICD-10-CM

## 2020-06-06 LAB — CBC: MCH: 28.8 pg (ref 27.0–33.0)

## 2020-06-06 NOTE — Progress Notes (Signed)
Tonya Ramirez 11/25/1965 [id number]  SUBJECTIVE:  55 y.o. G2P2 female for annual routine gynecologic exam. She has no gynecologic concerns.  Current Outpatient Medications  Medication Sig Dispense Refill  . amLODipine (NORVASC) 5 MG tablet Take 1 tablet (5 mg total) by mouth daily. 30 tablet 3  . amoxicillin-clavulanate (AUGMENTIN) 875-125 MG tablet Take 1 tablet by mouth 2 (two) times daily. 20 tablet 0  . cyanocobalamin 100 MCG tablet Take 100 mcg by mouth daily.    Marland Kitchen estradiol (ESTRACE) 1 MG tablet Take 1 tablet (1 mg total) by mouth daily. 90 tablet 0  . gabapentin (NEURONTIN) 300 MG capsule Take 300 mg by mouth 4 (four) times daily.    . Multiple Vitamin (MULTIVITAMIN) tablet Take 1 tablet by mouth daily.    . Omega-3 Fatty Acids (FISH OIL) 1000 MG CAPS Take 2 capsules by mouth daily.     No current facility-administered medications for this visit.   Allergies: Patient has no known allergies.  No LMP recorded. Patient has had a hysterectomy.  Past medical history,surgical history, problem list, medications, allergies, family history and social history were all reviewed and documented as reviewed in the EPIC chart.  ROS: Pertinent positives and negatives as reviewed in HPI   OBJECTIVE:  BP 122/80 (BP Location: Right Arm, Patient Position: Sitting, Cuff Size: Normal)   Ht 4\' 11"  (1.499 m)   Wt 93 lb (42.2 kg)   BMI 18.78 kg/m  The patient appears well, alert, oriented, in no distress. Lungs are clear, good air entry, no wheezes, rhonchi or rales. S1 and S2 normal, no murmurs, regular rate and rhythm.  Abdomen soft without tenderness, guarding, mass or organomegaly.  Neurological is normal, no focal findings.  BREAST EXAM: breasts appear normal, no suspicious masses, no skin or nipple changes or axillary nodes  PELVIC EXAM: VULVA: normal appearing vulva with atrophic change, no masses, tenderness or lesions, VAGINA: normal appearing vagina with atrophic change, normal color  and discharge, no lesions, CERVIX: surgically absent, UTERUS: surgically absent, vaginal cuff normal, ADNEXA: nontender and no masses  Chaperone: Bonham present during the examination  ASSESSMENT:  55 y.o. G2P2 here for annual gynecologic exam  PLAN:   1. Postmenopausal/HRT.  Continues on estradiol 1 mg daily.  Aware of the risks of thrombotic disease such prior attack, stroke, DVT, PE and the breast cancer issue.  Benefits outweigh risk and she would like to continue.  Refill x1 year provided.  Discussed considering weaning to 0.5 mg by cutting tablet in half and seeing how she does with that.  Prior TAH/BSO for endometriosis. 2. Pap smear 08/2017.  No history of abnormal Pap smears.  Discussed guidelines to stop screening following the history of prior hysterectomy normal Pap smears and she is comfortable with this. 3. Mammogram 2017.  Normal breast exam today.  Need for breast cancer screening discussed, the most common cancer diagnosed in women, early detection reports best ability to treat.  She acknowledges the recommendations. Names and contacts available upon request at checkout. 4. Colonoscopy never.  Need for colon cancer screening discussed, among the most common cancers diagnosed in women, early detection reports best ability to treat.  She acknowledges the recommendations.  Names and contacts available upon request at checkout. 5. DEXA never.  Would recommend that she do this within the next few years prior to age 1 given smaller stature and risk for osteoporosis.  She does supplement with a daily multivitamin.  We will check vitamin D level  today.  Indicates she gets adequate calcium in her diet. 6. Health maintenance.  She will proceed to lab today for routine screening blood work (lipids, CBC, CMP, vitamin D level).  The patient is aware that I will only be at this practice until early March 2022 so she knows to make sure she requests follow-up on any results if any testing  is completed when I am no longer at the practice.  Return annually or sooner, prn.  Tonya Majors MD 06/06/20

## 2020-06-07 LAB — CBC
HCT: 40.6 % (ref 35.0–45.0)
Hemoglobin: 13.7 g/dL (ref 11.7–15.5)
MCHC: 33.7 g/dL (ref 32.0–36.0)
MCV: 85.3 fL (ref 80.0–100.0)
MPV: 11 fL (ref 7.5–12.5)
Platelets: 255 10*3/uL (ref 140–400)
RBC: 4.76 10*6/uL (ref 3.80–5.10)
RDW: 12.5 % (ref 11.0–15.0)
WBC: 4.9 10*3/uL (ref 3.8–10.8)

## 2020-06-07 LAB — LIPID PANEL
Cholesterol: 181 mg/dL (ref <200)
HDL: 52 mg/dL (ref >=50)
LDL Cholesterol (Calc): 93 mg/dL (calc)
Non-HDL Cholesterol (Calc): 129 mg/dL (calc) (ref <130)
Total CHOL/HDL Ratio: 3.5 (calc) (ref <5.0)
Triglycerides: 274 mg/dL — ABNORMAL HIGH (ref <150)

## 2020-06-07 LAB — COMPREHENSIVE METABOLIC PANEL
AG Ratio: 1.7 (calc) (ref 1.0–2.5)
ALT: 23 U/L (ref 6–29)
AST: 22 U/L (ref 10–35)
Albumin: 4.3 g/dL (ref 3.6–5.1)
Alkaline phosphatase (APISO): 58 U/L (ref 37–153)
BUN: 15 mg/dL (ref 7–25)
CO2: 32 mmol/L (ref 20–32)
Calcium: 9.5 mg/dL (ref 8.6–10.4)
Chloride: 102 mmol/L (ref 98–110)
Creat: 0.89 mg/dL (ref 0.50–1.05)
Globulin: 2.6 g/dL (calc) (ref 1.9–3.7)
Glucose, Bld: 93 mg/dL (ref 65–99)
Potassium: 4.2 mmol/L (ref 3.5–5.3)
Sodium: 142 mmol/L (ref 135–146)
Total Bilirubin: 0.5 mg/dL (ref 0.2–1.2)
Total Protein: 6.9 g/dL (ref 6.1–8.1)

## 2020-06-07 LAB — VITAMIN D 25 HYDROXY (VIT D DEFICIENCY, FRACTURES): Vit D, 25-Hydroxy: 44 ng/mL (ref 30–100)

## 2020-06-10 ENCOUNTER — Encounter: Payer: Self-pay | Admitting: Obstetrics and Gynecology

## 2020-06-10 NOTE — Telephone Encounter (Signed)
No, I do not treat high cholesterol and lipids. I'm pretty sure Dr. Audie Box did not prescribe treatments such as statins either.

## 2020-06-10 NOTE — Telephone Encounter (Signed)
Other than making low fat dietary choices and regular exercise, I honestly do not know. This would be a question better fielded by an internist or family doctor.

## 2020-06-11 ENCOUNTER — Other Ambulatory Visit: Payer: Self-pay

## 2020-06-11 ENCOUNTER — Ambulatory Visit (INDEPENDENT_AMBULATORY_CARE_PROVIDER_SITE_OTHER): Payer: BC Managed Care – PPO | Admitting: Family Medicine

## 2020-06-11 ENCOUNTER — Other Ambulatory Visit: Payer: Self-pay | Admitting: Family Medicine

## 2020-06-11 ENCOUNTER — Encounter: Payer: Self-pay | Admitting: Family Medicine

## 2020-06-11 VITALS — BP 110/68 | HR 69 | Temp 98.4°F | Ht 60.0 in | Wt 93.0 lb

## 2020-06-11 DIAGNOSIS — E781 Pure hyperglyceridemia: Secondary | ICD-10-CM | POA: Diagnosis not present

## 2020-06-11 DIAGNOSIS — Z0000 Encounter for general adult medical examination without abnormal findings: Secondary | ICD-10-CM

## 2020-06-11 DIAGNOSIS — I10 Essential (primary) hypertension: Secondary | ICD-10-CM

## 2020-06-11 MED ORDER — ATORVASTATIN CALCIUM 40 MG PO TABS: 40.0000 mg | ORAL_TABLET | Freq: Every day | ORAL | 3 refills | Status: DC

## 2020-06-11 MED FILL — ATORVASTATIN CALCIUM 40 MG: 40 | 30 days supply | Qty: 30 | Fill #0

## 2020-06-11 MED FILL — GABAPENTIN 300 MG CAPSULE: 300 | 30 days supply | Qty: 120 | Fill #1

## 2020-06-11 NOTE — Progress Notes (Signed)
Chief Complaint  Patient presents with  . Results    Subjective: Patient is a 55 y.o. female here for abn lab f/u.  Pt had labs at GYN showing TG's of 274. Here for f/u. Over past several years, has been having higher levels. Diet is healthy. She walks intermittently. No CP or SOB.   Past Medical History:  Diagnosis Date  . Endometriosis   . Heart murmur   . Neck pain     Objective: BP 110/68 (BP Location: Left Arm, Patient Position: Sitting, Cuff Size: Normal)   Pulse 69   Temp 98.4 F (36.9 C) (Oral)   Ht 5' (1.524 m)   Wt 93 lb (42.2 kg)   SpO2 97%   BMI 18.16 kg/m  General: Awake, appears stated age Heart: RRR, no bruits, no LE edema Lungs: CTAB, no rales, wheezes or rhonchi. No accessory muscle use Psych: Age appropriate judgment and insight, normal affect and mood  Assessment and Plan: Hypertriglyceridemia - Plan: atorvastatin (LIPITOR) 40 MG tablet, Hepatic function panel, Lipid panel, TSH, T4, free  Start Lipitor 40 mg/d. Ck labs in 6 weeks. I will see her for her CPE in 3 mo.  The patient voiced understanding and agreement to the plan.  [name] [place], DO 06/11/20  12:07 PM

## 2020-06-11 NOTE — Patient Instructions (Signed)
Stay hydrated.   Keep the diet clean and stay active.  Let us know if you need anything. 

## 2020-07-07 ENCOUNTER — Other Ambulatory Visit: Payer: Self-pay | Admitting: Obstetrics and Gynecology

## 2020-07-07 MED FILL — ESTRADIOL 1 MG TABS: 1 | 30 days supply | Qty: 30 | Fill #0

## 2020-07-07 MED FILL — ATORVASTATIN CALCIUM 40 MG: 40 | 30 days supply | Qty: 30 | Fill #1

## 2020-07-23 ENCOUNTER — Other Ambulatory Visit: Payer: Self-pay

## 2020-07-23 ENCOUNTER — Other Ambulatory Visit (INDEPENDENT_AMBULATORY_CARE_PROVIDER_SITE_OTHER): Payer: BC Managed Care – PPO

## 2020-07-23 DIAGNOSIS — E781 Pure hyperglyceridemia: Secondary | ICD-10-CM | POA: Diagnosis not present

## 2020-07-23 LAB — HEPATIC FUNCTION PANEL
ALT: 24 U/L (ref 0–35)
AST: 25 U/L (ref 0–37)
Albumin: 4.4 g/dL (ref 3.5–5.2)
Alkaline Phosphatase: 65 U/L (ref 39–117)
Bilirubin, Direct: 0.1 mg/dL (ref 0.0–0.3)
Total Bilirubin: 0.4 mg/dL (ref 0.2–1.2)
Total Protein: 6.5 g/dL (ref 6.0–8.3)

## 2020-07-23 LAB — LIPID PANEL
Cholesterol: 133 mg/dL (ref 0–200)
HDL: 69.9 mg/dL (ref >39.00)
LDL Cholesterol: 50 mg/dL (ref 0–99)
NonHDL: 63.29
Total CHOL/HDL Ratio: 2
Triglycerides: 68 mg/dL (ref 0.0–149.0)
VLDL: 13.6 mg/dL (ref 0.0–40.0)

## 2020-07-23 LAB — T4, FREE: Free T4: 1.1 ng/dL (ref 0.60–1.60)

## 2020-07-23 LAB — TSH: TSH: 4.58 u[IU]/mL — ABNORMAL HIGH (ref 0.35–4.50)

## 2020-07-30 MED FILL — GABAPENTIN 300 MG CAPSULE: 300 | 30 days supply | Qty: 120 | Fill #2

## 2020-08-15 ENCOUNTER — Other Ambulatory Visit (HOSPITAL_BASED_OUTPATIENT_CLINIC_OR_DEPARTMENT_OTHER): Payer: Self-pay

## 2020-08-15 MED FILL — Estradiol Tab 1 MG: ORAL | 30 days supply | Qty: 30 | Fill #0 | Status: AC

## 2020-08-19 ENCOUNTER — Other Ambulatory Visit (HOSPITAL_BASED_OUTPATIENT_CLINIC_OR_DEPARTMENT_OTHER): Payer: Self-pay

## 2020-08-19 MED FILL — Atorvastatin Calcium Tab 40 MG (Base Equivalent): ORAL | 30 days supply | Qty: 30 | Fill #0 | Status: AC

## 2020-08-29 ENCOUNTER — Other Ambulatory Visit (HOSPITAL_COMMUNITY): Payer: Self-pay

## 2020-09-08 ENCOUNTER — Other Ambulatory Visit: Payer: Self-pay

## 2020-09-08 ENCOUNTER — Other Ambulatory Visit (INDEPENDENT_AMBULATORY_CARE_PROVIDER_SITE_OTHER): Payer: BC Managed Care – PPO

## 2020-09-08 DIAGNOSIS — E781 Pure hyperglyceridemia: Secondary | ICD-10-CM | POA: Diagnosis not present

## 2020-09-08 DIAGNOSIS — Z0000 Encounter for general adult medical examination without abnormal findings: Secondary | ICD-10-CM | POA: Diagnosis not present

## 2020-09-08 DIAGNOSIS — I10 Essential (primary) hypertension: Secondary | ICD-10-CM

## 2020-09-08 LAB — LIPID PANEL
Cholesterol: 117 mg/dL (ref 0–200)
HDL: 59.4 mg/dL (ref >39.00)
LDL Cholesterol: 38 mg/dL (ref 0–99)
NonHDL: 57.75
Total CHOL/HDL Ratio: 2
Triglycerides: 98 mg/dL (ref 0.0–149.0)
VLDL: 19.6 mg/dL (ref 0.0–40.0)

## 2020-09-08 LAB — COMPREHENSIVE METABOLIC PANEL
ALT: 22 U/L (ref 0–35)
AST: 22 U/L (ref 0–37)
Albumin: 4.2 g/dL (ref 3.5–5.2)
Alkaline Phosphatase: 64 U/L (ref 39–117)
BUN: 19 mg/dL (ref 6–23)
CO2: 30 mEq/L (ref 19–32)
Calcium: 8.8 mg/dL (ref 8.4–10.5)
Chloride: 105 mEq/L (ref 96–112)
Creatinine, Ser: 0.85 mg/dL (ref 0.40–1.20)
GFR: 77.25 mL/min (ref >60.00)
Glucose, Bld: 94 mg/dL (ref 70–99)
Potassium: 3.7 mEq/L (ref 3.5–5.1)
Sodium: 142 mEq/L (ref 135–145)
Total Bilirubin: 0.7 mg/dL (ref 0.2–1.2)
Total Protein: 6.5 g/dL (ref 6.0–8.3)

## 2020-09-10 ENCOUNTER — Encounter: Payer: BC Managed Care – PPO | Admitting: Family Medicine

## 2020-09-17 ENCOUNTER — Encounter: Payer: Self-pay | Admitting: Family Medicine

## 2020-09-17 ENCOUNTER — Other Ambulatory Visit: Payer: Self-pay

## 2020-09-17 ENCOUNTER — Ambulatory Visit (INDEPENDENT_AMBULATORY_CARE_PROVIDER_SITE_OTHER): Payer: BC Managed Care – PPO | Admitting: Family Medicine

## 2020-09-17 ENCOUNTER — Other Ambulatory Visit (HOSPITAL_BASED_OUTPATIENT_CLINIC_OR_DEPARTMENT_OTHER): Payer: Self-pay

## 2020-09-17 VITALS — BP 110/70 | HR 65 | Temp 98.2°F | Resp 16 | Ht 60.0 in | Wt 96.2 lb

## 2020-09-17 DIAGNOSIS — L659 Nonscarring hair loss, unspecified: Secondary | ICD-10-CM

## 2020-09-17 DIAGNOSIS — Z0000 Encounter for general adult medical examination without abnormal findings: Secondary | ICD-10-CM | POA: Diagnosis not present

## 2020-09-17 DIAGNOSIS — E781 Pure hyperglyceridemia: Secondary | ICD-10-CM | POA: Diagnosis not present

## 2020-09-17 MED ORDER — ATORVASTATIN CALCIUM 40 MG PO TABS
40.0000 mg | ORAL_TABLET | Freq: Every day | ORAL | 3 refills | Status: DC
  Filled 2020-09-17: qty 30, 30d supply, fill #0
  Filled 2020-09-17: qty 90, 90d supply, fill #0
  Filled 2020-09-26: qty 30, 30d supply, fill #0
  Filled 2020-11-19: qty 30, 30d supply, fill #1
  Filled 2020-12-19: qty 30, 30d supply, fill #2
  Filled 2021-01-20: qty 30, 30d supply, fill #3
  Filled 2021-02-20: qty 30, 30d supply, fill #4
  Filled 2021-03-16: qty 30, 30d supply, fill #5
  Filled 2021-05-08: qty 30, 30d supply, fill #6

## 2020-09-17 MED FILL — Gabapentin Cap 300 MG: ORAL | 30 days supply | Qty: 120 | Fill #0 | Status: AC

## 2020-09-17 MED FILL — Atorvastatin Calcium Tab 40 MG (Base Equivalent): ORAL | 30 days supply | Qty: 30 | Fill #1 | Status: CN

## 2020-09-17 MED FILL — Estradiol Tab 1 MG: ORAL | 30 days supply | Qty: 30 | Fill #1 | Status: AC

## 2020-09-17 NOTE — Patient Instructions (Addendum)
Give [place] 2-3 business days to get the results of your labs back.   If labs are normal, we will get you set up with the dermatology team.   Let me know what you decide about the colon cancer screening.   The new Shingrix vaccine (for shingles) is a 2 shot series. It can make people feel low energy, achy and almost like they have the flu for 48 hours after injection. Please plan accordingly when deciding on when to get this shot. Call our office for a nurse visit appointment to get this. The second shot of the series is less severe regarding the side effects, but it still lasts 48 hours.   Let us know if you need anything.

## 2020-09-17 NOTE — Progress Notes (Signed)
Chief Complaint  Patient presents with  . Hyperlipidemia  . Follow-up     Well Woman Tonya Ramirez is here for a complete physical.   Her last physical was >1 year ago.  Current diet: in general, a "healthy" diet. Current exercise: walking. Weight is stable and she denies fatigue out of ordinary. Seatbelt? Yes  Health Maintenance Mammogram- setting up Colon cancer screening-No Shingrix- No Tetanus- No Hep C screening- Yes HIV screening- Yes   Hair thinning Pt reports  Hair thinning over the past mo. She had normal T4 and high TSH at GYN. Would like it rechecked. Takes MV w iron. No new hair products or trauma.   Past Medical History:  Diagnosis Date  . Endometriosis   . Heart murmur   . Neck pain      Past Surgical History:  Procedure Laterality Date  . ABDOMINAL HYSTERECTOMY  2013   TAH BSO  . BARTHOLIN CYST MARSUPIALIZATION  2015  . CERVICAL FUSION    . CESAREAN SECTION    . PELVIC LAPAROSCOPY  2009   Endometriosis  . SURGERY OF LIP     X3    Medications  Current Outpatient Medications on File Prior to Visit  Medication Sig Dispense Refill  . cyanocobalamin 100 MCG tablet Take 100 mcg by mouth daily.    Marland Kitchen estradiol (ESTRACE) 1 MG tablet TAKE 1 TABLET (1 MG TOTAL) BY MOUTH DAILY. 90 tablet 3  . gabapentin (NEURONTIN) 300 MG capsule Take 300 mg by mouth 4 (four) times daily.    Marland Kitchen gabapentin (NEURONTIN) 300 MG capsule TAKE 2 (TWO) CAPSULES BY MOUTH TWO TIMES DAILY 120 capsule 5  . gabapentin (NEURONTIN) 300 MG capsule TAKE 2 (TWO) CAPSULES BY MOUTH TWO TIMES DAILY 120 capsule 5  . Multiple Vitamin (MULTIVITAMIN) tablet Take 1 tablet by mouth daily.    . Omega-3 Fatty Acids (FISH OIL) 1000 MG CAPS Take 2 capsules by mouth daily.     Allergies No Known Allergies  Review of Systems: Constitutional:  no unexpected weight changes Eye:  no recent significant change in vision Ear/Nose/Mouth/Throat:  Ears:  no recent change in hearing Nose/Mouth/Throat:  no  complaints of nasal congestion, no sore throat Cardiovascular: no chest pain Respiratory:  no shortness of breath Gastrointestinal:  no abdominal pain, no change in bowel habits GU:  Female: negative for dysuria or pelvic pain Musculoskeletal/Extremities:  No new pain of the joints Integumentary (Skin/Breast): +hair thinning Neurologic:  no headaches Endocrine:  denies fatigue  Exam BP 110/70 (BP Location: Right Arm, Patient Position: Sitting, Cuff Size: Normal)   Pulse 65   Temp 98.2 F (36.8 C) (Oral)   Resp 16   Ht 5' (1.524 m)   Wt 96 lb 3.2 oz (43.6 kg)   SpO2 97%   BMI 18.79 kg/m  General:  well developed, well nourished, in no apparent distress Skin: No patches of alopecia, no significant moles, warts, or growths Head:  no masses, lesions, or tenderness Eyes:  pupils equal and round, sclera anicteric without injection Ears:  canals without lesions, TMs shiny without retraction, no obvious effusion, no erythema Nose:  nares patent, septum midline, mucosa normal, and no drainage or sinus tenderness Throat/Pharynx:  lips and gingiva without lesion; tongue and uvula midline; non-inflamed pharynx; no exudates or postnasal drainage Neck: neck supple without adenopathy, thyromegaly, or masses Lungs:  clear to auscultation, breath sounds equal bilaterally, no respiratory distress Cardio:  regular rate and rhythm, no LE edema Abdomen:  abdomen soft, nontender;  bowel sounds normal; no masses or organomegaly Genital: Defer to GYN Musculoskeletal:  symmetrical muscle groups noted without atrophy or deformity Extremities:  no clubbing, cyanosis, or edema, no deformities, no skin discoloration Neuro:  gait normal; deep tendon reflexes normal and symmetric Psych: well oriented with normal range of affect and appropriate judgment/insight  Assessment and Plan  Well adult exam  Hypertriglyceridemia - Plan: atorvastatin (LIPITOR) 40 MG tablet  Hair thinning - Plan: TSH, T4, free, IBC  + Ferritin   Well 55 y.o. female. Counseled on diet and exercise. Other orders as above. She is setting up mammogram. She declines the covid vaccine. She declines ccs at this time, discuss colonoscopy and Cologard. She will think about it and let me know.  Shingrix rec'd, info provided in AVS.  Hair thinning: Ck labs, if neg will refer to derm.  Follow up in 6 mo or prn. The patient voiced understanding and agreement to the plan.  [name] [place], DO 09/17/20 4:28 PM

## 2020-09-18 LAB — IBC + FERRITIN
Ferritin: 74.8 ng/mL (ref 10.0–291.0)
Iron: 76 ug/dL (ref 42–145)
Saturation Ratios: 20.7 % (ref 20.0–50.0)
Transferrin: 262 mg/dL (ref 212.0–360.0)

## 2020-09-18 LAB — TSH: TSH: 2.7 u[IU]/mL (ref 0.35–4.50)

## 2020-09-18 LAB — T4, FREE: Free T4: 0.9 ng/dL (ref 0.60–1.60)

## 2020-09-18 NOTE — Addendum Note (Signed)
Addended byConrad [place] D on: 09/18/2020 01:11 PM   Modules accepted: Orders

## 2020-09-24 ENCOUNTER — Other Ambulatory Visit (HOSPITAL_BASED_OUTPATIENT_CLINIC_OR_DEPARTMENT_OTHER): Payer: Self-pay

## 2020-09-26 ENCOUNTER — Other Ambulatory Visit (HOSPITAL_BASED_OUTPATIENT_CLINIC_OR_DEPARTMENT_OTHER): Payer: Self-pay

## 2020-10-01 DIAGNOSIS — L658 Other specified nonscarring hair loss: Secondary | ICD-10-CM | POA: Diagnosis not present

## 2020-10-16 DIAGNOSIS — L648 Other androgenic alopecia: Secondary | ICD-10-CM | POA: Diagnosis not present

## 2020-10-30 ENCOUNTER — Other Ambulatory Visit (HOSPITAL_BASED_OUTPATIENT_CLINIC_OR_DEPARTMENT_OTHER): Payer: Self-pay

## 2020-10-30 MED FILL — Estradiol Tab 1 MG: ORAL | 30 days supply | Qty: 30 | Fill #2 | Status: AC

## 2020-11-07 ENCOUNTER — Other Ambulatory Visit (HOSPITAL_BASED_OUTPATIENT_CLINIC_OR_DEPARTMENT_OTHER): Payer: Self-pay

## 2020-11-07 MED FILL — Gabapentin Cap 300 MG: ORAL | 30 days supply | Qty: 120 | Fill #1 | Status: AC

## 2020-11-19 ENCOUNTER — Other Ambulatory Visit (HOSPITAL_BASED_OUTPATIENT_CLINIC_OR_DEPARTMENT_OTHER): Payer: Self-pay

## 2020-12-04 ENCOUNTER — Other Ambulatory Visit (HOSPITAL_BASED_OUTPATIENT_CLINIC_OR_DEPARTMENT_OTHER): Payer: Self-pay

## 2020-12-04 MED FILL — Estradiol Tab 1 MG: ORAL | 30 days supply | Qty: 30 | Fill #3 | Status: AC

## 2020-12-19 ENCOUNTER — Other Ambulatory Visit (HOSPITAL_BASED_OUTPATIENT_CLINIC_OR_DEPARTMENT_OTHER): Payer: Self-pay

## 2020-12-24 ENCOUNTER — Other Ambulatory Visit (HOSPITAL_BASED_OUTPATIENT_CLINIC_OR_DEPARTMENT_OTHER): Payer: Self-pay

## 2021-01-01 ENCOUNTER — Other Ambulatory Visit (HOSPITAL_BASED_OUTPATIENT_CLINIC_OR_DEPARTMENT_OTHER): Payer: Self-pay

## 2021-01-01 MED FILL — Gabapentin Cap 300 MG: ORAL | 30 days supply | Qty: 120 | Fill #2 | Status: AC

## 2021-01-09 ENCOUNTER — Other Ambulatory Visit (HOSPITAL_BASED_OUTPATIENT_CLINIC_OR_DEPARTMENT_OTHER): Payer: Self-pay

## 2021-01-09 MED FILL — Estradiol Tab 1 MG: ORAL | 30 days supply | Qty: 30 | Fill #4 | Status: AC

## 2021-01-20 ENCOUNTER — Other Ambulatory Visit (HOSPITAL_BASED_OUTPATIENT_CLINIC_OR_DEPARTMENT_OTHER): Payer: Self-pay

## 2021-02-03 ENCOUNTER — Ambulatory Visit (INDEPENDENT_AMBULATORY_CARE_PROVIDER_SITE_OTHER): Payer: BC Managed Care – PPO | Admitting: Family

## 2021-02-03 ENCOUNTER — Other Ambulatory Visit (HOSPITAL_BASED_OUTPATIENT_CLINIC_OR_DEPARTMENT_OTHER): Payer: Self-pay

## 2021-02-03 ENCOUNTER — Other Ambulatory Visit: Payer: Self-pay

## 2021-02-03 ENCOUNTER — Encounter: Payer: Self-pay | Admitting: Family

## 2021-02-03 VITALS — BP 136/88 | HR 80 | Temp 98.1°F | Ht 60.0 in | Wt 95.8 lb

## 2021-02-03 DIAGNOSIS — F32A Depression, unspecified: Secondary | ICD-10-CM

## 2021-02-03 DIAGNOSIS — F419 Anxiety disorder, unspecified: Secondary | ICD-10-CM

## 2021-02-03 DIAGNOSIS — G4700 Insomnia, unspecified: Secondary | ICD-10-CM

## 2021-02-03 MED ORDER — TRAZODONE HCL 50 MG PO TABS
25.0000 mg | ORAL_TABLET | Freq: Every evening | ORAL | 0 refills | Status: DC | PRN
  Filled 2021-02-03: qty 30, 30d supply, fill #0

## 2021-02-03 MED ORDER — SERTRALINE HCL 50 MG PO TABS
ORAL_TABLET | ORAL | 1 refills | Status: DC
  Filled 2021-02-03: qty 25, 30d supply, fill #0

## 2021-02-03 NOTE — Progress Notes (Signed)
Tonya Ramirez is a 55 y.o. female with the following history as recorded in EpicCare:  Patient Active Problem List   Diagnosis Date Noted   Hypertriglyceridemia 09/17/2020   Hair thinning 09/17/2020   Acute recurrent maxillary sinusitis 02/15/2020   Stress at work 07/24/2019   Hypertension 07/24/2019    Current Outpatient Medications  Medication Sig Dispense Refill   atorvastatin (LIPITOR) 40 MG tablet Take 1 tablet (40 mg total) by mouth daily. 90 tablet 3   estradiol (ESTRACE) 1 MG tablet TAKE 1 TABLET (1 MG TOTAL) BY MOUTH DAILY. 90 tablet 3   gabapentin (NEURONTIN) 300 MG capsule Take 300 mg by mouth 4 (four) times daily.     Multiple Vitamin (MULTIVITAMIN) tablet Take 1 tablet by mouth daily.     Omega-3 Fatty Acids (FISH OIL) 1000 MG CAPS Take 2 capsules by mouth daily.     sertraline (ZOLOFT) 50 MG tablet Take 1/2 tablet daily x 1 week; then increase to full tablet daily 30 tablet 1   traZODone (DESYREL) 50 MG tablet Take 1/2-1 tablet (25-50 mg total) by mouth at bedtime as needed for sleep. 30 tablet 0   No current facility-administered medications for this visit.    Allergies: Patient has no known allergies.  Past Medical History:  Diagnosis Date   Endometriosis    Heart murmur    Neck pain     Past Surgical History:  Procedure Laterality Date   ABDOMINAL HYSTERECTOMY  2013   TAH BSO   BARTHOLIN CYST MARSUPIALIZATION  2015   CERVICAL FUSION     CESAREAN SECTION     PELVIC LAPAROSCOPY  2009   Endometriosis   SURGERY OF LIP     X3    Family History  Problem Relation Age of Onset   COPD Mother    Hypertension Mother    Hypertension Father    Diabetes Father    Stroke Father     Social History   Tobacco Use   Smoking status: Never   Smokeless tobacco: Never  Substance Use Topics   Alcohol use: Never    Subjective:  Problems with increased anxiety/ depression/ insomnia; feels like she is having increased problems coping with family issues- does not feel  like she has open communication with her children and very upsetting; divorced in 2017 after 25 years; has dealt with this anxiety in the past- took Zoloft in the past and did well; would be open to re-starting Zoloft;  Has already reached out to therapist- would like to start [facility] counseling;  Is not suicidal;  Objective:  Vitals:   02/03/21 1136  BP: 136/88  Pulse: 80  Temp: 98.1 F (36.7 C)  TempSrc: Oral  SpO2: 97%  Weight: 95 lb 12.8 oz (43.5 kg)  Height: 5' (1.524 m)    General: Well developed, well nourished, in no acute distress ; tearful in office Skin : Warm and dry.  Head: Normocephalic and atraumatic  Lungs: Respirations unlabored;  Neurologic: Alert and oriented; speech intact; face symmetrical; moves all extremities well; CNII-XII intact without focal deficit   Assessment:  1. Anxiety and depression   2. Insomnia, unspecified type     Plan:  Labs were done in May 2022 and were normal; Will start Zoloft which patient has done well on in the past; will also give trial of Trazodone to help her sleep; she will follow up with counselor; Patient is not suicidal; offered FMLA but she has not been at her job for a  year;  Stressed to patient that she is important and her mental/ emotional health are important and she should be proud of asking for help/ reaching out today; she understands to let us know if she has questions or concerns; She is to see her PCP in 1 month for follow up;   Time spent 30 minutes  This visit occurred during the SARS-CoV-2 public health emergency.  Safety protocols were in place, including screening questions prior to the visit, additional usage of staff PPE, and extensive cleaning of exam room while observing appropriate contact time as indicated for disinfecting solutions.    Return in about 4 weeks (around 03/03/2021) for Dr. Carmelia Roller.  No orders of the defined types were placed in this encounter.   Requested Prescriptions   Signed  Prescriptions Disp Refills   sertraline (ZOLOFT) 50 MG tablet 30 tablet 1    Sig: Take 1/2 tablet daily x 1 week; then increase to full tablet daily   traZODone (DESYREL) 50 MG tablet 30 tablet 0    Sig: Take 1/2-1 tablet (25-50 mg total) by mouth at bedtime as needed for sleep.

## 2021-02-06 ENCOUNTER — Other Ambulatory Visit (HOSPITAL_BASED_OUTPATIENT_CLINIC_OR_DEPARTMENT_OTHER): Payer: Self-pay

## 2021-02-06 DIAGNOSIS — F411 Generalized anxiety disorder: Secondary | ICD-10-CM | POA: Diagnosis not present

## 2021-02-06 MED FILL — Estradiol Tab 1 MG: ORAL | 30 days supply | Qty: 30 | Fill #5 | Status: AC

## 2021-02-13 DIAGNOSIS — F411 Generalized anxiety disorder: Secondary | ICD-10-CM | POA: Diagnosis not present

## 2021-02-16 ENCOUNTER — Other Ambulatory Visit (HOSPITAL_BASED_OUTPATIENT_CLINIC_OR_DEPARTMENT_OTHER): Payer: Self-pay

## 2021-02-17 ENCOUNTER — Other Ambulatory Visit (HOSPITAL_BASED_OUTPATIENT_CLINIC_OR_DEPARTMENT_OTHER): Payer: Self-pay

## 2021-02-17 MED ORDER — GABAPENTIN 300 MG PO CAPS
ORAL_CAPSULE | ORAL | 5 refills | Status: DC
  Filled 2021-02-17: qty 120, 30d supply, fill #0

## 2021-02-18 ENCOUNTER — Other Ambulatory Visit (HOSPITAL_BASED_OUTPATIENT_CLINIC_OR_DEPARTMENT_OTHER): Payer: Self-pay

## 2021-02-20 ENCOUNTER — Other Ambulatory Visit (HOSPITAL_BASED_OUTPATIENT_CLINIC_OR_DEPARTMENT_OTHER): Payer: Self-pay

## 2021-02-20 DIAGNOSIS — F411 Generalized anxiety disorder: Secondary | ICD-10-CM | POA: Diagnosis not present

## 2021-02-27 DIAGNOSIS — F411 Generalized anxiety disorder: Secondary | ICD-10-CM | POA: Diagnosis not present

## 2021-03-04 ENCOUNTER — Other Ambulatory Visit: Payer: Self-pay

## 2021-03-04 ENCOUNTER — Encounter: Payer: Self-pay | Admitting: Family Medicine

## 2021-03-04 ENCOUNTER — Ambulatory Visit (INDEPENDENT_AMBULATORY_CARE_PROVIDER_SITE_OTHER): Payer: BC Managed Care – PPO | Admitting: Family Medicine

## 2021-03-04 ENCOUNTER — Other Ambulatory Visit (HOSPITAL_BASED_OUTPATIENT_CLINIC_OR_DEPARTMENT_OTHER): Payer: Self-pay

## 2021-03-04 VITALS — BP 120/82 | HR 48 | Temp 98.2°F | Ht 60.0 in | Wt 95.4 lb

## 2021-03-04 DIAGNOSIS — F411 Generalized anxiety disorder: Secondary | ICD-10-CM | POA: Diagnosis not present

## 2021-03-04 DIAGNOSIS — F339 Major depressive disorder, recurrent, unspecified: Secondary | ICD-10-CM | POA: Diagnosis not present

## 2021-03-04 MED ORDER — SERTRALINE HCL 50 MG PO TABS: ORAL_TABLET | ORAL | 1 refills | Status: DC

## 2021-03-04 MED ORDER — TRAZODONE HCL 50 MG PO TABS
75.0000 mg | ORAL_TABLET | Freq: Every evening | ORAL | 2 refills | Status: DC | PRN
Start: 2021-03-04 — End: 2021-04-03
  Filled 2021-03-04: qty 60, 30d supply, fill #0

## 2021-03-04 NOTE — Patient Instructions (Signed)
Stay active.  Coping skills Choose 5 that work for you: Take a deep breath Count to 20 Read a book Do a puzzle Meditate Bake Sing Knit Garden Pray Go outside Call a friend Listen to music Take a walk Color Send a note Take a bath Watch a movie Be alone in a quiet place Pet an animal Visit a friend Journal Exercise Stretch   Let us know if you need anything.

## 2021-03-04 NOTE — Progress Notes (Signed)
Chief Complaint  Patient presents with   Follow-up    Subjective Tonya Ramirez presents for f/u anxiety/depression.  Pt is currently being treated with Zoloft 50 mg/d and trazodone 25-50 mg qhs prn.  Reports some improvement since treatment. She does not cry I think she normally would even when she was mentally healthy.  She does not like this emotional blunting. No thoughts of harming self or others. Having racing thoughts.  No self-medication with alcohol, prescription drugs or illicit drugs. Pt is following with a counselor/psychologist.  Past Medical History:  Diagnosis Date   Endometriosis    Heart murmur    Neck pain    Allergies as of 03/04/2021   No Known Allergies      Medication List        Accurate as of March 04, 2021  4:58 PM. If you have any questions, ask your nurse or doctor.          atorvastatin 40 MG tablet Commonly known as: LIPITOR Take 1 tablet (40 mg total) by mouth daily.   estradiol 1 MG tablet Commonly known as: ESTRACE TAKE 1 TABLET (1 MG TOTAL) BY MOUTH DAILY.   Fish Oil 1000 MG Caps Take 2 capsules by mouth daily.   gabapentin 300 MG capsule Commonly known as: NEURONTIN Take 300 mg by mouth 4 (four) times daily. What changed: Another medication with the same name was removed. Continue taking this medication, and follow the directions you see here. Changed by: Sharlene Dory, DO   multivitamin tablet Take 1 tablet by mouth daily.   sertraline 50 MG tablet Commonly known as: ZOLOFT Take 1/2 tablet daily What changed: additional instructions Changed by: Sharlene Dory, DO   traZODone 50 MG tablet Commonly known as: DESYREL Take 1.5-2 tablets (75-100 mg total) by mouth at bedtime as needed for sleep. What changed: how much to take Changed by: Sharlene Dory, DO        Exam BP 120/82   Pulse (!) 48   Temp 98.2 F (36.8 C) (Oral)   Ht 5' (1.524 m)   Wt 95 lb 6 oz (43.3 kg)   SpO2 98%   BMI  18.63 kg/m  General:  well developed, well nourished, in no apparent distress Lungs:  No respiratory distress Psych: well oriented with normal range of affect and age-appropriate judgement/insight, alert and oriented x4.  Assessment and Plan  Depression, recurrent (HCC) - Plan: sertraline (ZOLOFT) 50 MG tablet, traZODone (DESYREL) 50 MG tablet  GAD (generalized anxiety disorder) - Plan: sertraline (ZOLOFT) 50 MG tablet, traZODone (DESYREL) 50 MG tablet  Chronic, uncontrolled.  He states she is improving on the Zoloft.  On the current dose of Zoloft, she does not like the emotional blunting she is experiencing.  We will decrease back to 25 mg daily and increase her dosage of trazodone to 75-100 mg nightly as needed.  Continue with the counseling team and recommended routine exercise.  I will see her in the next month to recheck.  Cancel the appointment next week.  Anxiety coping techniques provided in her AVS. The patient voiced understanding and agreement to the plan.  [name] [place], DO 03/04/21 4:58 PM

## 2021-03-06 DIAGNOSIS — F411 Generalized anxiety disorder: Secondary | ICD-10-CM | POA: Diagnosis not present

## 2021-03-09 ENCOUNTER — Other Ambulatory Visit (HOSPITAL_BASED_OUTPATIENT_CLINIC_OR_DEPARTMENT_OTHER): Payer: Self-pay

## 2021-03-10 ENCOUNTER — Other Ambulatory Visit: Payer: Self-pay | Admitting: Family Medicine

## 2021-03-10 ENCOUNTER — Other Ambulatory Visit (HOSPITAL_BASED_OUTPATIENT_CLINIC_OR_DEPARTMENT_OTHER): Payer: Self-pay

## 2021-03-10 DIAGNOSIS — F339 Major depressive disorder, recurrent, unspecified: Secondary | ICD-10-CM

## 2021-03-10 DIAGNOSIS — F411 Generalized anxiety disorder: Secondary | ICD-10-CM

## 2021-03-10 MED ORDER — SERTRALINE HCL 50 MG PO TABS
ORAL_TABLET | ORAL | 1 refills | Status: DC
Start: 2021-03-10 — End: 2021-03-24
  Filled 2021-03-10: qty 15, 30d supply, fill #0

## 2021-03-10 NOTE — Telephone Encounter (Signed)
Correct, she should be taking 1/2 tab (25 mg) daily. If she needs more, OK to send. Ty.

## 2021-03-10 NOTE — Telephone Encounter (Signed)
This looks like it was decrease at her last OV?

## 2021-03-10 NOTE — Telephone Encounter (Signed)
Clarified with Romeo Apple in the pharmacy. Sent in refill as PCP prescribed.

## 2021-03-12 ENCOUNTER — Other Ambulatory Visit (HOSPITAL_BASED_OUTPATIENT_CLINIC_OR_DEPARTMENT_OTHER): Payer: Self-pay

## 2021-03-12 DIAGNOSIS — F411 Generalized anxiety disorder: Secondary | ICD-10-CM | POA: Diagnosis not present

## 2021-03-16 ENCOUNTER — Other Ambulatory Visit (HOSPITAL_BASED_OUTPATIENT_CLINIC_OR_DEPARTMENT_OTHER): Payer: Self-pay

## 2021-03-16 MED FILL — Estradiol Tab 1 MG: ORAL | 30 days supply | Qty: 30 | Fill #6 | Status: AC

## 2021-03-20 ENCOUNTER — Ambulatory Visit: Payer: BC Managed Care – PPO | Admitting: Family Medicine

## 2021-03-24 ENCOUNTER — Encounter: Payer: Self-pay | Admitting: Family Medicine

## 2021-03-24 ENCOUNTER — Other Ambulatory Visit: Payer: Self-pay

## 2021-03-24 ENCOUNTER — Ambulatory Visit (INDEPENDENT_AMBULATORY_CARE_PROVIDER_SITE_OTHER): Payer: BC Managed Care – PPO | Admitting: Family Medicine

## 2021-03-24 ENCOUNTER — Other Ambulatory Visit (HOSPITAL_BASED_OUTPATIENT_CLINIC_OR_DEPARTMENT_OTHER): Payer: Self-pay

## 2021-03-24 VITALS — BP 108/62 | HR 75 | Temp 98.2°F | Ht 60.0 in | Wt 94.2 lb

## 2021-03-24 DIAGNOSIS — F339 Major depressive disorder, recurrent, unspecified: Secondary | ICD-10-CM

## 2021-03-24 DIAGNOSIS — F411 Generalized anxiety disorder: Secondary | ICD-10-CM | POA: Diagnosis not present

## 2021-03-24 MED ORDER — ESCITALOPRAM OXALATE 10 MG PO TABS
10.0000 mg | ORAL_TABLET | Freq: Every day | ORAL | 2 refills | Status: DC
  Filled 2021-03-24: qty 30, 30d supply, fill #0

## 2021-03-24 NOTE — Patient Instructions (Signed)
Stay active.   Let's stop the Zoloft. The next time you were due to take the Zoloft, please take the Lexapro instead.  Let us know if you need anything.

## 2021-03-24 NOTE — Progress Notes (Signed)
Chief Complaint  Patient presents with   Follow-up    Medication change     Subjective Tonya Ramirez presents for f/u anxiety/depression.  Pt is currently being treated with Zoloft 25 mg/d, Trazodone 25-50 mg qhs prn.  Reports doing worse with anxiety and depression since treatment. No thoughts of harming self or others. No self-medication with alcohol, prescription drugs or illicit drugs. She is walking.  Pt is following with a counselor/psychologist.  Past Medical History:  Diagnosis Date   Endometriosis    Heart murmur    Neck pain    Allergies as of 03/24/2021   No Known Allergies      Medication List        Accurate as of March 24, 2021  3:49 PM. If you have any questions, ask your nurse or doctor.          STOP taking these medications    sertraline 50 MG tablet Commonly known as: ZOLOFT Stopped by: Sharlene Dory, DO       TAKE these medications    atorvastatin 40 MG tablet Commonly known as: LIPITOR Take 1 tablet (40 mg total) by mouth daily.   escitalopram 10 MG tablet Commonly known as: Lexapro Take 1 tablet (10 mg total) by mouth daily. Started by: Sharlene Dory, DO   estradiol 1 MG tablet Commonly known as: ESTRACE TAKE 1 TABLET (1 MG TOTAL) BY MOUTH DAILY.   Fish Oil 1000 MG Caps Take 2 capsules by mouth daily.   gabapentin 300 MG capsule Commonly known as: NEURONTIN Take 300 mg by mouth 4 (four) times daily.   multivitamin tablet Take 1 tablet by mouth daily.   traZODone 50 MG tablet Commonly known as: DESYREL Take 1.5-2 tablets (75-100 mg total) by mouth at bedtime as needed for sleep.        Exam BP 108/62   Pulse 75   Temp 98.2 F (36.8 C) (Oral)   Ht 5' (1.524 m)   Wt 94 lb 4 oz (42.8 kg)   SpO2 99%   BMI 18.41 kg/m  General:  well developed, well nourished, in no apparent distress Lungs:  No respiratory distress Psych: well oriented with normal range of affect and age-appropriate  judgement/insight, alert and oriented x4. Did become tearful during exam several times.  Assessment and Plan  Depression, recurrent (HCC) - Plan: escitalopram (LEXAPRO) 10 MG tablet  GAD (generalized anxiety disorder) - Plan: escitalopram (LEXAPRO) 10 MG tablet  Chronic, unstable. Cont w counseling. Stop Zoloft, add Lexapro 10 mg/d, cont Trazodone prn. Counseled on exercise.  F/u in 1.5 weeks as originally scheduled.  The patient voiced understanding and agreement to the plan.  [name] [place], DO 03/24/21 3:49 PM

## 2021-03-25 ENCOUNTER — Encounter: Payer: Self-pay | Admitting: Family Medicine

## 2021-03-30 ENCOUNTER — Other Ambulatory Visit (HOSPITAL_BASED_OUTPATIENT_CLINIC_OR_DEPARTMENT_OTHER): Payer: Self-pay

## 2021-03-30 ENCOUNTER — Encounter: Payer: Self-pay | Admitting: Family Medicine

## 2021-04-03 ENCOUNTER — Encounter: Payer: Self-pay | Admitting: Family Medicine

## 2021-04-03 ENCOUNTER — Ambulatory Visit (INDEPENDENT_AMBULATORY_CARE_PROVIDER_SITE_OTHER): Payer: BC Managed Care – PPO | Admitting: Family Medicine

## 2021-04-03 ENCOUNTER — Other Ambulatory Visit (HOSPITAL_BASED_OUTPATIENT_CLINIC_OR_DEPARTMENT_OTHER): Payer: Self-pay

## 2021-04-03 VITALS — BP 118/76 | HR 60 | Temp 98.4°F | Ht 60.0 in | Wt 97.1 lb

## 2021-04-03 DIAGNOSIS — F411 Generalized anxiety disorder: Secondary | ICD-10-CM | POA: Diagnosis not present

## 2021-04-03 DIAGNOSIS — F339 Major depressive disorder, recurrent, unspecified: Secondary | ICD-10-CM

## 2021-04-03 MED ORDER — QUETIAPINE FUMARATE 25 MG PO TABS
25.0000 mg | ORAL_TABLET | Freq: Every day | ORAL | 2 refills | Status: DC
  Filled 2021-04-03: qty 30, 30d supply, fill #0

## 2021-04-03 NOTE — Patient Instructions (Signed)
Stay active.  Let's stay on the Lexapro, stop the trazodone. We are replacing it.   Let us know if you need anything.

## 2021-04-03 NOTE — Progress Notes (Signed)
Chief Complaint  Patient presents with   Follow-up    Subjective Tonya Ramirez presents for f/u depression.  Pt is currently being treated with Lexapro 10 mg/d (changed from Zoloft), trazodone 75-100 mg qhs (increased from 10 d ago).  Reports poorly since treatment and not noticing any changes.  She is having anxiety symptoms.  No thoughts of harming self or others. No self-medication with alcohol, prescription drugs or illicit drugs. Pt is following with a counselor/psychologist.  Past Medical History:  Diagnosis Date   Endometriosis    Heart murmur    Neck pain     Exam BP 118/76   Pulse 60   Temp 98.4 F (36.9 C) (Oral)   Ht 5' (1.524 m)   Wt 97 lb 2 oz (44.1 kg)   SpO2 98%   BMI 18.97 kg/m  General:  well developed, well nourished, in no apparent distress Lungs:  No respiratory distress Psych: well oriented with normal range of affect and age-appropriate judgement/insight, alert and oriented x4.  Assessment and Plan  Depression, recurrent (HCC) - Plan: QUEtiapine (SEROQUEL) 25 MG tablet  GAD (generalized anxiety disorder) - Plan: QUEtiapine (SEROQUEL) 25 MG tablet  Chronic, unstable. Cont Lexapro 10 mg/d, stop trazodone, replace w Seroquel 25 mg qhs. Cont w counseling team. Counseled on exercise.  F/u in 3 weeks. The patient voiced understanding and agreement to the plan.  [name] [place], DO 04/03/21 9:03 AM

## 2021-04-08 ENCOUNTER — Telehealth: Payer: Self-pay | Admitting: Family Medicine

## 2021-04-08 NOTE — Telephone Encounter (Signed)
The patient does want extended leave for 30 days or what PCP thinks is best. She did see the Neuro. They did a MRI/feels her problem may be due to stress Call back asap [phone].

## 2021-04-08 NOTE — Telephone Encounter (Signed)
Spoke to the patient and will need to be FMLA since past 3 days. [employer] will be faxing over paperwork today. Would like to start tomorrow 04/09/21

## 2021-04-09 ENCOUNTER — Encounter: Payer: Self-pay | Admitting: Family Medicine

## 2021-04-09 NOTE — Telephone Encounter (Signed)
Patient called to let us know that we were supposed to receive the faxed paper today, she was informed that we had not received it yet so she will call them and get it faxed again.

## 2021-04-10 ENCOUNTER — Telehealth: Payer: Self-pay | Admitting: Family Medicine

## 2021-04-10 NOTE — Telephone Encounter (Signed)
Pt dropped forms off to be filled out, 3 pages with yellow sticky attached. Contact pt once forms are completed, will pick up from office. Placed inside of providers tray.

## 2021-04-13 ENCOUNTER — Telehealth: Payer: Self-pay | Admitting: Family Medicine

## 2021-04-13 NOTE — Telephone Encounter (Signed)
[employer] called to verify last work date and diagnosis. Also stated they will be faxing over forms. Reference #[id number]

## 2021-04-13 NOTE — Telephone Encounter (Signed)
PCP completed/patient informed to pickup Copied to scan

## 2021-04-16 ENCOUNTER — Other Ambulatory Visit (HOSPITAL_COMMUNITY): Payer: Self-pay

## 2021-04-16 ENCOUNTER — Other Ambulatory Visit (HOSPITAL_BASED_OUTPATIENT_CLINIC_OR_DEPARTMENT_OTHER): Payer: Self-pay

## 2021-04-17 ENCOUNTER — Telehealth: Payer: Self-pay | Admitting: Family Medicine

## 2021-04-17 NOTE — Telephone Encounter (Signed)
Forms faxed in and placed in wendling bin up front

## 2021-04-17 NOTE — Telephone Encounter (Signed)
[employer]: [phone]  Calling to request the following information: Date of disability Diagnoses Treatment Plan  Claim #: [id number]

## 2021-04-20 ENCOUNTER — Other Ambulatory Visit (HOSPITAL_BASED_OUTPATIENT_CLINIC_OR_DEPARTMENT_OTHER): Payer: Self-pay

## 2021-04-20 DIAGNOSIS — Z0279 Encounter for issue of other medical certificate: Secondary | ICD-10-CM

## 2021-04-20 MED ORDER — GABAPENTIN 300 MG PO CAPS
ORAL_CAPSULE | ORAL | 0 refills | Status: DC
  Filled 2021-04-20: qty 120, 30d supply, fill #0

## 2021-04-20 MED FILL — Estradiol Tab 1 MG: ORAL | 30 days supply | Qty: 30 | Fill #7 | Status: AC

## 2021-04-20 NOTE — Telephone Encounter (Signed)
Form completed and faxed. 

## 2021-04-21 ENCOUNTER — Other Ambulatory Visit (HOSPITAL_BASED_OUTPATIENT_CLINIC_OR_DEPARTMENT_OTHER): Payer: Self-pay

## 2021-04-24 ENCOUNTER — Other Ambulatory Visit (HOSPITAL_BASED_OUTPATIENT_CLINIC_OR_DEPARTMENT_OTHER): Payer: Self-pay

## 2021-04-24 ENCOUNTER — Ambulatory Visit (INDEPENDENT_AMBULATORY_CARE_PROVIDER_SITE_OTHER): Payer: BC Managed Care – PPO | Admitting: Family Medicine

## 2021-04-24 ENCOUNTER — Encounter: Payer: Self-pay | Admitting: Family Medicine

## 2021-04-24 VITALS — BP 116/72 | HR 72 | Temp 98.5°F | Resp 16 | Ht 60.0 in | Wt 98.5 lb

## 2021-04-24 DIAGNOSIS — F411 Generalized anxiety disorder: Secondary | ICD-10-CM | POA: Diagnosis not present

## 2021-04-24 DIAGNOSIS — F339 Major depressive disorder, recurrent, unspecified: Secondary | ICD-10-CM

## 2021-04-24 MED ORDER — MIRTAZAPINE 15 MG PO TABS
15.0000 mg | ORAL_TABLET | Freq: Every day | ORAL | 2 refills | Status: DC
  Filled 2021-04-24: qty 30, 30d supply, fill #0

## 2021-04-24 NOTE — Progress Notes (Signed)
Chief Complaint  Patient presents with   Follow-up    Subjective Sol Odor presents for f/u anxiety/depression.  Pt is currently being treated with Lexapro 10 mg/d, Seroquel 25 mg qhs.  Reports no improvement since treatment. Had vivid and bad dreams on the Seroquel.  No thoughts of harming self or others. No self-medication with alcohol, prescription drugs or illicit drugs. Pt is following with a counselor/psychologist.  Past Medical History:  Diagnosis Date   Endometriosis    Heart murmur    Neck pain    Allergies as of 04/24/2021   No Known Allergies      Medication List        Accurate as of April 24, 2021  9:36 AM. If you have any questions, ask your nurse or doctor.          STOP taking these medications    QUEtiapine 25 MG tablet Commonly known as: SEROquel Stopped by: Sharlene Dory, DO       TAKE these medications    atorvastatin 40 MG tablet Commonly known as: LIPITOR Take 1 tablet (40 mg total) by mouth daily.   escitalopram 10 MG tablet Commonly known as: Lexapro Take 1 tablet (10 mg total) by mouth daily.   estradiol 1 MG tablet Commonly known as: ESTRACE TAKE 1 TABLET (1 MG TOTAL) BY MOUTH DAILY.   Fish Oil 1000 MG Caps Take 2 capsules by mouth daily.   gabapentin 300 MG capsule Commonly known as: NEURONTIN Take 2 capsules by mouth 2 times daily What changed: Another medication with the same name was removed. Continue taking this medication, and follow the directions you see here. Changed by: Sharlene Dory, DO   multivitamin tablet Take 1 tablet by mouth daily.        Exam BP 116/72    Pulse 72    Temp 98.5 F (36.9 C)    Resp 16    Ht 5' (1.524 m)    Wt 98 lb 8 oz (44.7 kg)    SpO2 97%    BMI 19.24 kg/m  General:  well developed, well nourished, in no apparent distress Lungs:  No respiratory distress Psych: well oriented with normal range of affect and age-appropriate judgement/insight, alert and  oriented x4.  Assessment and Plan  Depression, recurrent (HCC) - Plan: Ambulatory referral to Psychiatry  GAD (generalized anxiety disorder) - Plan: Ambulatory referral to Psychiatry  Chronic, not stable. Cont Lexapro 10 mg/d, stop Seroquel. Start Remeron 15 mg qhs. Refer psychiatry. Psych and counseling resources provided in AVS. Stay active. F/u in 2 weeks. The patient voiced understanding and agreement to the plan.  [name] [place], DO 04/24/21 9:36 AM

## 2021-04-24 NOTE — Patient Instructions (Signed)
[facility] [address] [place], [place] [address] [phone]  [facility] [address] [place], [place] [address] [phone]  [facility] health [address] [place], [place] [address] [phone]  [facility] [address], [place], [place], #[phone] [address], [place], [place], #[phone]  [facility] [address] [place], [place] [address] [phone]  [facility] [address] [place], [place] [phone]  [facility] [address] [place], [place] [phone]  [facility] [address] [place], [place]   [phone].   Please go online to complete a form to submit first.  [facility]  [address] [place], [place] [phone].     [facility] -  local practices located at: [address] [place], [place]. [phone]. [address] [place], [place], [place].  [phone]. [address], [place], [place].  [phone].  Contact one of these offices sooner than later as it can take 2-3 months to get a new patient appointment.   Please consider counseling. Contact [phone] to schedule an appointment or inquire about cost/insurance coverage.  [facility] located at [address], [place], [place].  Phone number = [phone].  Dr. Regan Lemming - Adult Psychiatry.    [facility] located at [address] [place], [place], [place]. Phone number = [phone].   [facility] located at [address] [place], [place].  Phone number = [phone].   [facility] located at [address] [place], [place], [place].  Phone number = [phone].

## 2021-05-08 ENCOUNTER — Other Ambulatory Visit: Payer: Self-pay | Admitting: Family Medicine

## 2021-05-08 ENCOUNTER — Encounter: Payer: Self-pay | Admitting: Family Medicine

## 2021-05-08 ENCOUNTER — Other Ambulatory Visit (HOSPITAL_BASED_OUTPATIENT_CLINIC_OR_DEPARTMENT_OTHER): Payer: Self-pay

## 2021-05-08 ENCOUNTER — Ambulatory Visit (INDEPENDENT_AMBULATORY_CARE_PROVIDER_SITE_OTHER): Payer: BC Managed Care – PPO | Admitting: Family Medicine

## 2021-05-08 VITALS — BP 120/76 | HR 55 | Temp 98.2°F | Ht 60.0 in | Wt 98.4 lb

## 2021-05-08 DIAGNOSIS — F339 Major depressive disorder, recurrent, unspecified: Secondary | ICD-10-CM

## 2021-05-08 DIAGNOSIS — F411 Generalized anxiety disorder: Secondary | ICD-10-CM

## 2021-05-08 MED ORDER — HYDROXYZINE PAMOATE 25 MG PO CAPS
25.0000 mg | ORAL_CAPSULE | Freq: Three times a day (TID) | ORAL | 0 refills | Status: DC | PRN
  Filled 2021-05-08: qty 60, 10d supply, fill #0

## 2021-05-08 NOTE — Progress Notes (Signed)
Chief Complaint  Patient presents with   Follow-up    Still not sleeping well. Medication is causing nightmares, headaches, constipation and/or diarrhea    Subjective Tonya Ramirez presents for f/u anxiety/depression.  Pt is currently being treated with Lexapro 10 mg/d, Remeron 15 mg qhs.  Reports no improvement since treatment. No benefit from REmeron, did have diarrhea and back pain a/w it.  No thoughts of harming self or others. No self-medication with alcohol, prescription drugs or illicit drugs. Pt is not following with a counselor/psychologist. Scheduled 1st appt on 05/19/21.  Past Medical History:  Diagnosis Date   Endometriosis    Heart murmur    Neck pain    Allergies as of 05/08/2021   No Known Allergies      Medication List        Accurate as of May 08, 2021 11:50 AM. If you have any questions, ask your nurse or doctor.          STOP taking these medications    mirtazapine 15 MG tablet Commonly known as: REMERON Stopped by: Shelda Pal, DO       TAKE these medications    atorvastatin 40 MG tablet Commonly known as: LIPITOR Take 1 tablet (40 mg total) by mouth daily.   escitalopram 10 MG tablet Commonly known as: Lexapro Take 1 tablet (10 mg total) by mouth daily.   estradiol 1 MG tablet Commonly known as: ESTRACE TAKE 1 TABLET (1 MG TOTAL) BY MOUTH DAILY.   Fish Oil 1000 MG Caps Take 2 capsules by mouth daily.   gabapentin 300 MG capsule Commonly known as: NEURONTIN Take 2 capsules by mouth 2 times daily   multivitamin tablet Take 1 tablet by mouth daily.        Exam BP 120/76    Pulse (!) 55    Temp 98.2 F (36.8 C) (Oral)    Ht 5' (1.524 m)    Wt 98 lb 6 oz (44.6 kg)    SpO2 99%    BMI 19.21 kg/m  General:  well developed, well nourished, in no apparent distress Lungs:  No respiratory distress Psych: well oriented with normal range of affect and age-appropriate judgement/insight, alert and oriented  x4.  Assessment and Plan  Depression, recurrent (Between)  GAD (generalized anxiety disorder)  Chronic, unstable. Cont Lexapro. Stop Remeron.  A list of psychiatric resources provided today.  I would like her to schedule an appointment as soon as possible.  She will see me back a message with a list of the medications that her son is taking for similar issues.  I would be open to prescribing some of these at least until she can get in with psychiatry.  She has an appointment with a therapist in a week and a half.  Encouraged to keep this.  For now, will extend FMLA for 5 more weeks.  F/u in 2-3 weeks pending her message. The patient voiced understanding and agreement to the plan.  [place], DO 05/08/21 11:50 AM

## 2021-05-08 NOTE — Patient Instructions (Signed)
[facility] [address] [place], [place] [address] [phone]  [facility] [address] [place], [place] [address] [phone]  [facility] health [address] [place], [place] [address] [phone]  [facility] [address], [place], [place], [phone] [address], [place], [place], #[phone]  [facility] [address] [place], [place] [address] [phone]  [facility] Psychiatric and Counseling [address] [place], [place] [phone]  [facility] [address] [place], [place] [phone]  [facility] [address] [place], [place]   [phone].   Please go online to complete a form to submit first.  [facility]  [address] [place], [place] [phone].     [facility] -  local practices located at: [address] [place], [place]. [phone]. [address] [place], [place], [place].  [phone]. [address], [place], [place].  [phone].  Contact one of these offices sooner than later as it can take 2-3 months to get a new patient appointment.

## 2021-05-19 ENCOUNTER — Ambulatory Visit (INDEPENDENT_AMBULATORY_CARE_PROVIDER_SITE_OTHER): Payer: BC Managed Care – PPO | Admitting: Clinical

## 2021-05-19 ENCOUNTER — Other Ambulatory Visit: Payer: Self-pay

## 2021-05-19 DIAGNOSIS — F419 Anxiety disorder, unspecified: Secondary | ICD-10-CM | POA: Diagnosis not present

## 2021-05-19 DIAGNOSIS — F32A Depression, unspecified: Secondary | ICD-10-CM

## 2021-05-19 DIAGNOSIS — Z566 Other physical and mental strain related to work: Secondary | ICD-10-CM | POA: Diagnosis not present

## 2021-05-19 NOTE — Progress Notes (Signed)
Comprehensive Clinical Assessment (CCA) Note  05/19/2021 Tonya Ramirez [id number] Virtual Visit via Video Note  I connected with Tonya Ramirez on 05/19/21 at  9:00 AM EST by a video enabled telemedicine application and verified that I am speaking with the correct person using two identifiers.  Location: Patient: home Provider: office   I discussed the limitations of evaluation and management by telemedicine and the availability of in person appointments. The patient expressed understanding and agreed to proceed.   I discussed the assessment and treatment plan with the patient. The patient was provided an opportunity to ask questions and all were answered. The patient agreed with the plan and demonstrated an understanding of the instructions.   The patient was advised to call back or seek an in-person evaluation if the symptoms worsen or if the condition fails to improve as anticipated.  I provided 60 minutes of non-face-to-face time during this encounter.  Chief Complaint:  Chief Complaint  Patient presents with   Anxiety   Depression   Visit Diagnosis: Anxiety and depression    Work related stress     CCA Screening, Triage and Referral (STR)  Patient Reported Information How did you hear about [place]? Primary Care  Referral name: No data recorded Referral phone number: No data recorded  Whom do you see for routine medical problems? No data recorded Practice/Facility Name: No data recorded Practice/Facility Phone Number: No data recorded Name of Contact: No data recorded Contact Number: No data recorded Contact Fax Number: No data recorded Prescriber Name: No data recorded Prescriber Address (if known): No data recorded  What Is the Reason for Your Visit/Call Today? Anxiety and depression. Pt says she was previously receiving therapy but did not find it beneficial.  How Long Has This Been Causing You Problems? 1-6 months  What Do You Feel Would Help You the Most Today? No  data recorded  Have You Recently Been in Any Inpatient Treatment (Hospital/Detox/Crisis Center/28-Day Program)? No (Denies hx)  Name/Location of Program/Hospital:No data recorded How Long Were You There? No data recorded When Were You Discharged? No data recorded  Have You Ever Received Services From [facility] Before? No  Who Do You See at [facility]? No data recorded  Have You Recently Had Any Thoughts About Hurting Yourself? No  Are You Planning to Commit Suicide/Harm Yourself At This time? No   Have you Recently Had Thoughts About [place]? No  Explanation: No data recorded  Have You Used Any Alcohol or Drugs in the Past 24 Hours? No  How Long Ago Did You Use Drugs or Alcohol? No data recorded What Did You Use and How Much? No data recorded  Do You Currently Have a Therapist/Psychiatrist? No  Name of Therapist/Psychiatrist: No data recorded  Have You Been Recently Discharged From Any Office Practice or Programs? No data recorded Explanation of Discharge From Practice/Program: No data recorded    CCA Screening Triage Referral Assessment Type of Contact: No data recorded Is this Initial or Reassessment? No data recorded Date Telepsych consult ordered in [facility]:  No data recorded Time Telepsych consult ordered in [facility]:  No data recorded  Patient Reported Information Reviewed? No data recorded Patient Left Without Being Seen? No data recorded Reason for Not Completing Assessment: No data recorded  Collateral Involvement: No data recorded  Does Patient Have a [place]? No data recorded Name and Contact of Legal Guardian: No data recorded If Minor and Not Living with Parent(s), Who has Custody? No data  recorded Is CPS involved or ever been involved? Never  Is APS involved or ever been involved? Never   Patient Determined To Be At Risk for Harm To Self or Others Based on Review of Patient Reported Information or Presenting Complaint?  No  Method: No data recorded Availability of Means: No data recorded Intent: No data recorded Notification Required: No data recorded Additional Information for Danger to Others Potential: No data recorded Additional Comments for Danger to Others Potential: No data recorded Are There Guns or Other Weapons in Your Home? No, pt denies access Types of Guns/Weapons: No data recorded Are These Weapons Safely Secured?                            No data recorded Who Could Verify You Are Able To Have These Secured: No data recorded Do You Have any Outstanding Charges, Pending Court Dates, Parole/Probation? No data recorded Contacted To Inform of Risk of Harm To Self or Others: No data recorded  Location of Assessment: No data recorded  Does Patient Present under Involuntary Commitment? No data recorded IVC Papers Initial File Date: No data recorded  [place] of Residence: No data recorded  Patient Currently Receiving the Following Services: Not Receiving Services   Determination of Need: Routine (7 days)   Options For Referral: Outpatient Therapy     CCA Biopsychosocial Intake/Chief Complaint:  Hx of depression and anxiety  Current Symptoms/Problems: Difficulty concentrating, difficulty falling and staying asleep(avg 2-3 hrs), crying spells, irritability   Patient Reported Schizophrenia/Schizoaffective Diagnosis in Past: No   Strengths: Smart, caring  Preferences: None reported  Abilities: Willingness to participate in therapy   Type of Services Patient Feels are Needed: Individual therapy   Initial Clinical Notes/Concerns: Pt reports she has been on FMLA since December 2022 due to physical and mental health reasons. Pt states experiencing work related stress and reporting having strained relationship with supervisor. Pt denies any SI/HI no AH/VH reported. Pt encouraged to call 911 or go to closest ED in the event of an emergency.  Mental Health Symptoms Depression:    Sleep (too much or little); Hopelessness; Worthlessness; Irritability; Difficulty Concentrating; Fatigue; Tearfulness; Increase/decrease in appetite; Change in energy/activity   Duration of Depressive symptoms:  Greater than two weeks   Mania:   Irritability   Anxiety:    Difficulty concentrating; Fatigue; Irritability; Sleep; Worrying; Restlessness (Worries-job, self worth, relationships)   Psychosis:   None   Duration of Psychotic symptoms: No data recorded  Trauma:   None   Obsessions:   None   Compulsions:   N/A   Inattention:   None   Hyperactivity/Impulsivity:   None   Oppositional/Defiant Behaviors:   N/A   Emotional Irregularity:   Unstable self-image; Mood lability; Chronic feelings of emptiness   Other Mood/Personality Symptoms:  No data recorded   Mental Status Exam Appearance and self-care  Stature:  No data recorded  Weight:  No data recorded  Clothing:   Casual   Grooming:   Normal   Cosmetic use:   None   Posture/gait:   Normal   Motor activity:   Not Remarkable   Sensorium  Attention:   Normal   Concentration:   Normal   Orientation:   X5   Recall/memory:   Normal   Affect and Mood  Affect:   Appropriate; Tearful   Mood:   Depressed   Relating  Eye contact:   Normal   Facial expression:  Sad   Attitude toward examiner:   Cooperative   Thought and Language  Speech flow:  Clear and Coherent   Thought content:   Appropriate to Mood and Circumstances   Preoccupation:   None   Hallucinations:   None   Organization:  No data recorded  [employer] of Knowledge:   Good   Intelligence:   Average   Abstraction:   Normal   Judgement:   Good   Reality Testing:   Adequate   Insight:   Good   Decision Making:   Normal   Social Functioning  Social Maturity:   Responsible   Social Judgement:   Normal   Stress  Stressors:   Relationship; Work   Coping Ability:    Programme researcher, broadcasting/film/video Deficits:   None   Supports:   Friends/Service system     Religion:    Leisure/Recreation: Leisure / [place]?: Yes Leisure and Hobbies: walking  Exercise/Diet: Exercise/Diet Do You Exercise?: Yes What Type of Exercise Do You Do?: Run/Walk How Many Times a Week Do You Exercise?: 1-3 times a week Do You Have Any Trouble Sleeping?: Yes Explanation of Sleeping Difficulties: Difficulty falling and staying asleep   CCA Employment/Education Employment/Work Situation: Employment / Work Situation Employment Situation: Employed Where is Patient Currently Employed?: [employer] How Long has Patient Been Employed?: 1 yr Are You Satisfied With Your Job?: Yes Work Stressors: "Im not being utilized and I dont feel secure" Pt reports having strained relationship with supervisor Patient's Job has Been Impacted by Current Illness: No Has Patient ever Been in the [employer]?: No  Education: Education Did Teacher, adult education From [employer]?: Yes Did Physicist, medical?: Yes Did You Have An Individualized Education Program (IIEP): No Did You Have Any Difficulty At [employer]?: No Patient's Education Has Been Impacted by Current Illness: No   CCA Family/Childhood History Family and Relationship History: Family history Marital status: Divorced What types of issues is patient dealing with in the relationship?: Pt says she found out her ex-husband was gay during the marriage Does patient have children?: Yes How many children?: 2 How is patient's relationship with their children?: Daughter is an Therapist, sports and works nights; good relationship with son  Childhood History:  Childhood History By whom was/is the patient raised?: Both parents Description of patient's relationship with caregiver when they were a child: Pt says parents were strict but had a good childhood Patient's description of current relationship with people who raised him/her: Parents  deceased, were very close. Does patient have siblings?: Yes Number of Siblings: 3 Description of patient's current relationship with siblings: Older sister, 2 younger brothers Did patient suffer any verbal/emotional/physical/sexual abuse as a child?: No Did patient suffer from severe childhood neglect?: No Has patient ever been sexually abused/assaulted/raped as an adolescent or adult?: No Witnessed domestic violence?: No Has patient been affected by domestic violence as an adult?: No  Child/Adolescent Assessment:     CCA Substance Use Alcohol/Drug Use: Alcohol / Drug Use Pain Medications: see mar Prescriptions: see mar Over the Counter: see mar History of alcohol / drug use?: No history of alcohol / drug abuse                         ASAM's:  Six Dimensions of Multidimensional Assessment  Dimension 1:  Acute Intoxication and/or Withdrawal Potential:      Dimension 2:  Biomedical Conditions and Complications:  Dimension 3:  Emotional, Behavioral, or Cognitive Conditions and Complications:     Dimension 4:  Readiness to Change:     Dimension 5:  Relapse, Continued use, or Continued Problem Potential:     Dimension 6:  Recovery/Living Environment:     ASAM Severity Score:    ASAM Recommended Level of Treatment:     Substance use Disorder (SUD)    Recommendations for Services/Supports/Treatments: Recommendations for Services/Supports/Treatments Recommendations For Services/Supports/Treatments: Individual Therapy, Medication Management  DSM5 Diagnoses: Patient Active Problem List   Diagnosis Date Noted   Depression, recurrent ([place]) 03/04/2021   GAD (generalized anxiety disorder) 03/04/2021   Hypertriglyceridemia 09/17/2020   Hair thinning 09/17/2020   Acute recurrent maxillary sinusitis 02/15/2020   Stress at work 07/24/2019   Hypertension 07/24/2019    Patient Centered Plan: Patient is on the following Treatment Plan(s):  Anxiety and  Depression   Referrals to Alternative Service(s): Referred to Alternative Service(s):   Place:   Date:   Time:    Referred to Alternative Service(s):   Place:   Date:   Time:    Referred to Alternative Service(s):   Place:   Date:   Time:    Referred to Alternative Service(s):   Place:   Date:   Time:     Yvette Rack, LCSW

## 2021-05-19 NOTE — Plan of Care (Signed)
Pt will develop coping skills to manage depression and anxiety sxs as evidenced by being outside in nature/going for walks 2 out of 7 days for up to 30 minutes; daily prayer and meditation. Pt participated in completion of treatment plan.

## 2021-05-29 ENCOUNTER — Ambulatory Visit (INDEPENDENT_AMBULATORY_CARE_PROVIDER_SITE_OTHER): Payer: BC Managed Care – PPO | Admitting: Family Medicine

## 2021-05-29 ENCOUNTER — Encounter: Payer: Self-pay | Admitting: Family Medicine

## 2021-05-29 ENCOUNTER — Other Ambulatory Visit (HOSPITAL_BASED_OUTPATIENT_CLINIC_OR_DEPARTMENT_OTHER): Payer: Self-pay

## 2021-05-29 ENCOUNTER — Other Ambulatory Visit: Payer: Self-pay | Admitting: Family Medicine

## 2021-05-29 VITALS — BP 100/64 | HR 52 | Temp 98.3°F | Ht 60.0 in | Wt 101.4 lb

## 2021-05-29 DIAGNOSIS — F411 Generalized anxiety disorder: Secondary | ICD-10-CM | POA: Diagnosis not present

## 2021-05-29 DIAGNOSIS — Z1231 Encounter for screening mammogram for malignant neoplasm of breast: Secondary | ICD-10-CM | POA: Diagnosis not present

## 2021-05-29 DIAGNOSIS — F339 Major depressive disorder, recurrent, unspecified: Secondary | ICD-10-CM

## 2021-05-29 DIAGNOSIS — G4700 Insomnia, unspecified: Secondary | ICD-10-CM

## 2021-05-29 DIAGNOSIS — F32A Depression, unspecified: Secondary | ICD-10-CM

## 2021-05-29 DIAGNOSIS — F419 Anxiety disorder, unspecified: Secondary | ICD-10-CM

## 2021-05-29 DIAGNOSIS — M791 Myalgia, unspecified site: Secondary | ICD-10-CM

## 2021-05-29 DIAGNOSIS — T466X5A Adverse effect of antihyperlipidemic and antiarteriosclerotic drugs, initial encounter: Secondary | ICD-10-CM | POA: Insufficient documentation

## 2021-05-29 MED ORDER — ROSUVASTATIN CALCIUM 10 MG PO TABS
10.0000 mg | ORAL_TABLET | Freq: Every day | ORAL | 3 refills | Status: DC
  Filled 2021-05-29: qty 30, 30d supply, fill #0
  Filled 2021-07-09: qty 30, 30d supply, fill #1
  Filled 2021-08-28: qty 30, 30d supply, fill #2
  Filled 2021-09-30: qty 30, 30d supply, fill #3

## 2021-05-29 MED ORDER — BUSPIRONE HCL 7.5 MG PO TABS
7.5000 mg | ORAL_TABLET | Freq: Two times a day (BID) | ORAL | 2 refills | Status: DC
  Filled 2021-05-29: qty 60, 30d supply, fill #0
  Filled 2021-07-09: qty 60, 30d supply, fill #1

## 2021-05-29 MED ORDER — CLONAZEPAM 0.5 MG PO TABS
0.5000 mg | ORAL_TABLET | Freq: Two times a day (BID) | ORAL | 1 refills | Status: DC | PRN
  Filled 2021-05-29: qty 30, 15d supply, fill #0
  Filled 2021-07-09: qty 30, 15d supply, fill #1

## 2021-05-29 NOTE — Progress Notes (Signed)
Chief Complaint  Patient presents with   Follow-up    Subjective Tonya Ramirez presents for f/u anxiety/depression.  Pt is currently being treated with Lexapro 10 mg/d, Hydroxyzine 25 mg prn. Tried trazodone but it caused wt gain and headaches. +depression, anxiety, insomnia, poor energy Hydroxyzine didn't help either.   Reports the same since treatment. No thoughts of harming self or others. No self-medication with alcohol, prescription drugs or illicit drugs. Pt is following with a counselor/psychologist.  Myalgias in wrists and back with Lipitor. Stopped and it improved. Started again when she went back on it.   Past Medical History:  Diagnosis Date   Endometriosis    Heart murmur    Neck pain    Allergies as of 05/29/2021   No Known Allergies      Medication List        Accurate as of May 29, 2021 11:12 AM. If you have any questions, ask your nurse or doctor.          STOP taking these medications    atorvastatin 40 MG tablet Commonly known as: LIPITOR Stopped by: Sharlene Dory, DO   hydrOXYzine 25 MG capsule Commonly known as: VISTARIL Stopped by: Sharlene Dory, DO       TAKE these medications    busPIRone 7.5 MG tablet Commonly known as: BUSPAR Take 1 tablet (7.5 mg total) by mouth 2 (two) times daily. Started by: Sharlene Dory, DO   clonazePAM 0.5 MG tablet Commonly known as: KLONOPIN Take 1 tablet (0.5 mg total) by mouth 2 (two) times daily as needed for anxiety. Started by: Sharlene Dory, DO   escitalopram 10 MG tablet Commonly known as: Lexapro Take 1 tablet (10 mg total) by mouth daily.   estradiol 1 MG tablet Commonly known as: ESTRACE TAKE 1 TABLET (1 MG TOTAL) BY MOUTH DAILY.   Fish Oil 1000 MG Caps Take 2 capsules by mouth daily.   gabapentin 300 MG capsule Commonly known as: NEURONTIN Take 2 capsules by mouth 2 times daily   multivitamin tablet Take 1 tablet by mouth daily.    rosuvastatin 10 MG tablet Commonly known as: Crestor Take 1 tablet (10 mg total) by mouth daily. Started by: Sharlene Dory, DO        Exam BP 100/64    Pulse (!) 52    Temp 98.3 F (36.8 C) (Oral)    Ht 5' (1.524 m)    Wt 101 lb 6 oz (46 kg)    SpO2 99%    BMI 19.80 kg/m  General:  well developed, well nourished, in no apparent distress Lungs:  No respiratory distress Psych: well oriented with normal range of affect and age-appropriate judgement/insight, alert and oriented x4.  Assessment and Plan  Depression, recurrent (HCC)  GAD (generalized anxiety disorder) - Plan: busPIRone (BUSPAR) 7.5 MG tablet, clonazePAM (KLONOPIN) 0.5 MG tablet  Insomnia, unspecified type  Encounter for screening mammogram for malignant neoplasm of breast - Plan: MM DIGITAL SCREENING BILATERAL  Myalgia due to statin - Plan: rosuvastatin (CRESTOR) 10 MG tablet  Needs to reach out to psych. Will try from our end as well. Cont Lexapro 10 mg/d. Add BuSpar 7.5 mg bid. Add Klonopin 0.5 mg bid prn. Warnings about this verbalized and written down. Cont w counseling. FMLA: Extend 6 weeks from 06/10/21.  F/u in 3 weeks. Stop Lipitor. Change to Crestor 10 mg/d. Ck labs in 6 weeks.  The patient voiced understanding and agreement to the plan.  [name] [place], DO 05/29/21 11:12 AM

## 2021-05-29 NOTE — Patient Instructions (Signed)
Please reach out to the psychiatry resources again.  Stay active.   Do not drink alcohol, do any illicit/street drugs, drive or do anything that requires alertness while on the Klonopin.   Let us know if you need anything.  [facility] [address] [place], [place] [address] [phone]  [facility] [address] [place], [place] [address] [phone]  [facility] [address] [place], [place] [address] [phone]  [facility] [address], [place], [place], [phone] [address], [place], [place], [phone]  [facility] [address] [place], [place] [address] [phone]  [facility] [address] [place], [place] [phone]  [facility] [address] [place], [place] [phone]  [facility] [address] [place], [place]   [phone].   Please go online to complete a form to submit first.  [facility]  [address] [place], [place] [phone].     [facility] -  local practices located at: [address] [place], [place]. [phone]. [address] [place], [place], [place].  [phone]. [address], [place], [place].  [phone].  Contact one of these offices sooner than later as it can take 2-3 months to get a new patient appointment.

## 2021-06-01 ENCOUNTER — Other Ambulatory Visit: Payer: Self-pay

## 2021-06-01 ENCOUNTER — Ambulatory Visit (INDEPENDENT_AMBULATORY_CARE_PROVIDER_SITE_OTHER): Payer: BC Managed Care – PPO | Admitting: Clinical

## 2021-06-01 DIAGNOSIS — Z566 Other physical and mental strain related to work: Secondary | ICD-10-CM

## 2021-06-01 DIAGNOSIS — F419 Anxiety disorder, unspecified: Secondary | ICD-10-CM | POA: Diagnosis not present

## 2021-06-01 DIAGNOSIS — F32A Depression, unspecified: Secondary | ICD-10-CM | POA: Diagnosis not present

## 2021-06-01 NOTE — Progress Notes (Signed)
° °  THERAPIST PROGRESS NOTE  Session Time: 9am  Participation Level: Active  Behavioral Response: CasualAlerttearful at times  Type of Therapy: Individual Therapy  Treatment Goals addressed: Coping  Interventions: CBT Virtual Visit via Video Note  I connected with Council Mechanic on 06/01/21 at  9:00 AM EST by a video enabled telemedicine application and verified that I am speaking with the correct person using two identifiers.  Location: Patient: home Provider: office   I discussed the limitations of evaluation and management by telemedicine and the availability of in person appointments. The patient expressed understanding and agreed to proceed.   I discussed the assessment and treatment plan with the patient. The patient was provided an opportunity to ask questions and all were answered. The patient agreed with the plan and demonstrated an understanding of the instructions.   The patient was advised to call back or seek an in-person evaluation if the symptoms worsen or if the condition fails to improve as anticipated.  I provided 55 minutes of non-face-to-face time during this encounter.  Summary: Pt presents in tearful manner as she discussed the death anniversary of her mother on 1/23. Pt says she has not allowed herself to grieve loss of mother due to caring for others at the time of the event. Pt reports poor sleep cycle and says she has been prescribed various medications but nothing has helped at this time. Pt also discussed side effects she is experiencing from other prescribed medications. Pt expressed lack of self worth after she found out during marriage ex-husband was attracted to men. Pt says she has developed trust issues and questions whether someone will love her wholeheartedly. Additionally, pt states her son is bisexual and she does not approve of his preferences.   Suicidal/Homicidal: Pt denies SI/HI no AH/VH reported. No plan, intent or attempt to harm self or others  reported. Pt encouraged to call 911 or go to closest ED if needed.  Therapist Response: validated feelings and supported pt as she identified and discussed difficulty trusting others and lack of self worth. Processed with pt complicated emotions that can be experienced after a loss(mother, husband). Processed coping with the loss in a new reality without those individuals. Identified coping methods to adjust to the loss in healthy way such as attending support group, being in nature, going on walks, attending church. Pt states having minimal supports, discussed broadening support system. HW=identify needs, wants to build happiness.  Plan: Return again in 2 weeks.  Diagnosis: Axis I: anxiety and depression    Work related stress    Axis II: No diagnosis    Suzan Slick, LCSW 06/01/2021

## 2021-06-02 ENCOUNTER — Other Ambulatory Visit (HOSPITAL_BASED_OUTPATIENT_CLINIC_OR_DEPARTMENT_OTHER): Payer: Self-pay

## 2021-06-02 MED FILL — Estradiol Tab 1 MG: ORAL | 30 days supply | Qty: 30 | Fill #8 | Status: AC

## 2021-06-08 ENCOUNTER — Encounter: Payer: Self-pay | Admitting: Family Medicine

## 2021-06-09 ENCOUNTER — Encounter: Payer: Self-pay | Admitting: Family Medicine

## 2021-06-10 ENCOUNTER — Ambulatory Visit: Payer: BC Managed Care – PPO | Admitting: Family Medicine

## 2021-06-16 ENCOUNTER — Other Ambulatory Visit: Payer: Self-pay | Admitting: Family Medicine

## 2021-06-16 ENCOUNTER — Telehealth: Payer: Self-pay | Admitting: Family Medicine

## 2021-06-16 ENCOUNTER — Ambulatory Visit: Payer: BC Managed Care – PPO | Admitting: Family Medicine

## 2021-06-16 ENCOUNTER — Encounter: Payer: Self-pay | Admitting: Family Medicine

## 2021-06-16 ENCOUNTER — Other Ambulatory Visit: Payer: Self-pay

## 2021-06-16 ENCOUNTER — Other Ambulatory Visit (HOSPITAL_BASED_OUTPATIENT_CLINIC_OR_DEPARTMENT_OTHER): Payer: Self-pay

## 2021-06-16 ENCOUNTER — Ambulatory Visit (INDEPENDENT_AMBULATORY_CARE_PROVIDER_SITE_OTHER): Payer: BC Managed Care – PPO | Admitting: Clinical

## 2021-06-16 DIAGNOSIS — Z566 Other physical and mental strain related to work: Secondary | ICD-10-CM | POA: Diagnosis not present

## 2021-06-16 DIAGNOSIS — F419 Anxiety disorder, unspecified: Secondary | ICD-10-CM

## 2021-06-16 DIAGNOSIS — F32A Depression, unspecified: Secondary | ICD-10-CM | POA: Diagnosis not present

## 2021-06-16 MED ORDER — GABAPENTIN 300 MG PO CAPS
ORAL_CAPSULE | ORAL | 0 refills | Status: DC
  Filled 2021-06-16: qty 120, 30d supply, fill #0

## 2021-06-16 NOTE — Telephone Encounter (Signed)
Sent in

## 2021-06-16 NOTE — Progress Notes (Signed)
° °  THERAPIST PROGRESS NOTE  Session Time: 2pm  Participation Level: Active  Behavioral Response: Casual and DisheveledAlertAnxious and tearful  Type of Therapy: Individual Therapy  Treatment Goals addressed: Coping strategies  ProgressTowards Goals: Revised minima progress  Interventions: CBT Virtual Visit via Video Note  I connected with Council Mechanic on 06/16/21 at  2:00 PM EST by a video enabled telemedicine application and verified that I am speaking with the correct person using two identifiers.  Location: Patient: home Provider: office   I discussed the limitations of evaluation and management by telemedicine and the availability of in person appointments. The patient expressed understanding and agreed to proceed.   I discussed the assessment and treatment plan with the patient. The patient was provided an opportunity to ask questions and all were answered. The patient agreed with the plan and demonstrated an understanding of the instructions.   The patient was advised to call back or seek an in-person evaluation if the symptoms worsen or if the condition fails to improve as anticipated.  I provided 52 minutes of non-face-to-face time during this encounter.  Summary: Pt presents in tearful manner. Pt says for 4 days she has been having crying spells, poor sleep pattern, passive SI with no attempt or plan to harm self. Pt reports, she has not grieved the loss of her mother and states father has declining health. Pt says she has appt with doctor today for medication management. Pt says she has been taking buspar for 1.5 weeks and questions if it is causing sxs. Pt discussed feeling undervalued and mistreated at work. Pt states she has accommodations at work that are not being respected by management. Pt states she has been going for walks to improve mood. Pt reports she did not practice interventions discussed last session.  Suicidal/Homicidal: Pt denies SI/HI but says she has  experienced low mood and crying spells. Denies plan, intent or attempt to harm self or others.  Therapist Response: processed with pt benefits of participating in support group. Provided pt with information on grief share, [facility] and [facility] for group therapy. Pt says she is interested in participating. Discussed with pt cognitive triangle and how changing thinking can change feeling and behavior. Assisted her in identifying coping methods she can commit to practicing to help manage stressors.  Plan: Return again in 1 weeks.  Diagnosis: anxiety and depression                                     Work related stress  Collaboration of Care: Other none  Patient/Guardian was advised Release of Information must be obtained prior to any record release in order to collaborate their care with an outside provider. Patient/Guardian was advised if they have not already done so to contact the registration department to sign all necessary forms in order for [place] to release information regarding their care.   Consent: Patient/Guardian gives verbal consent for treatment and assignment of benefits for services provided during this telehealth visit. Patient/Guardian expressed understanding and agreed to proceed.   Suzan Slick, LCSW 06/16/2021

## 2021-06-16 NOTE — Telephone Encounter (Signed)
Rx #: [id number]  gabapentin (NEURONTIN) 300 MG capsule [id number]

## 2021-06-17 ENCOUNTER — Telehealth: Payer: Self-pay | Admitting: Family Medicine

## 2021-06-17 ENCOUNTER — Encounter: Payer: Self-pay | Admitting: Family Medicine

## 2021-06-17 ENCOUNTER — Other Ambulatory Visit: Payer: Self-pay | Admitting: Family Medicine

## 2021-06-17 ENCOUNTER — Ambulatory Visit (INDEPENDENT_AMBULATORY_CARE_PROVIDER_SITE_OTHER): Payer: BC Managed Care – PPO | Admitting: Family Medicine

## 2021-06-17 ENCOUNTER — Other Ambulatory Visit (HOSPITAL_BASED_OUTPATIENT_CLINIC_OR_DEPARTMENT_OTHER): Payer: Self-pay

## 2021-06-17 ENCOUNTER — Other Ambulatory Visit: Payer: Self-pay

## 2021-06-17 VITALS — BP 108/70 | HR 58 | Temp 98.8°F | Ht 60.0 in | Wt 99.0 lb

## 2021-06-17 DIAGNOSIS — F4323 Adjustment disorder with mixed anxiety and depressed mood: Secondary | ICD-10-CM | POA: Diagnosis not present

## 2021-06-17 DIAGNOSIS — F411 Generalized anxiety disorder: Secondary | ICD-10-CM | POA: Diagnosis not present

## 2021-06-17 DIAGNOSIS — Z1231 Encounter for screening mammogram for malignant neoplasm of breast: Secondary | ICD-10-CM

## 2021-06-17 DIAGNOSIS — F339 Major depressive disorder, recurrent, unspecified: Secondary | ICD-10-CM

## 2021-06-17 MED ORDER — LAMOTRIGINE 25 MG PO TABS
ORAL_TABLET | ORAL | 0 refills | Status: DC
Start: 2021-06-17 — End: 2022-08-18
  Filled 2021-06-17: qty 60, 30d supply, fill #0

## 2021-06-17 NOTE — Telephone Encounter (Signed)
Appt today and can take care of.

## 2021-06-17 NOTE — Telephone Encounter (Signed)
Patient called stating she was not able to print them but she sent it through her Mychart.

## 2021-06-17 NOTE — Progress Notes (Signed)
3

## 2021-06-17 NOTE — Telephone Encounter (Signed)
Forms faxed into front office Placed into wendling bin up front

## 2021-06-17 NOTE — Progress Notes (Signed)
Chief Complaint  Patient presents with   Follow-up    Subjective Tonya Ramirez presents for f/u anxiety/depression.  Pt is currently being treated with Lexapro 10 mg/d, BuSpar 7.5 mg bid, Klonopin 0.5 mg bid prn. .  Reports no change since treatment. She has been more emotionally labile and will cry frequently with seemingly minimal stimulus.  No thoughts of harming self or others. No self-medication with alcohol, prescription drugs or illicit drugs. Pt is following with a counselor/psychologist. Feels this is somewhat helpful but has just started sessions. Fortunately she has an appt with psychiatry coming up. Unfortunately it is not until 08/10/21.   Past Medical History:  Diagnosis Date   Endometriosis    Heart murmur    Neck pain    Allergies as of 06/17/2021   No Known Allergies      Medication List        Accurate as of June 17, 2021  9:50 AM. If you have any questions, ask your nurse or doctor.          busPIRone 7.5 MG tablet Commonly known as: BUSPAR Take 1 tablet (7.5 mg total) by mouth 2 (two) times daily.   clonazePAM 0.5 MG tablet Commonly known as: KLONOPIN Take 1 tablet (0.5 mg total) by mouth 2 (two) times daily as needed for anxiety.   escitalopram 10 MG tablet Commonly known as: Lexapro Take 1 tablet (10 mg total) by mouth daily.   estradiol 1 MG tablet Commonly known as: ESTRACE TAKE 1 TABLET (1 MG TOTAL) BY MOUTH DAILY.   Fish Oil 1000 MG Caps Take 2 capsules by mouth daily.   gabapentin 300 MG capsule Commonly known as: NEURONTIN Take 2 capsules by mouth 2 times daily   lamoTRIgine 25 MG tablet Commonly known as: LAMICTAL Take 1 tab daily for 2 weeks and then 1 tab twice daily. Started by: Sharlene Dory, DO   multivitamin tablet Take 1 tablet by mouth daily.   rosuvastatin 10 MG tablet Commonly known as: Crestor Take 1 tablet (10 mg total) by mouth daily.        Exam BP 108/70    Pulse (!) 58    Temp 98.8 F  (37.1 C) (Oral)    Ht 5' (1.524 m)    Wt 99 lb (44.9 kg)    SpO2 99%    BMI 19.33 kg/m  General:  well developed, well nourished, in no apparent distress Lungs:  No respiratory distress Neuro: Alert/oriented to person, place, date, president, president in 29's, struggled w serial 7's Psych: flat affect, tearful throughout exam  Assessment and Plan  Depression, recurrent (HCC) - Plan: lamoTRIgine (LAMICTAL) 25 MG tablet  GAD (generalized anxiety disorder) - Plan: lamoTRIgine (LAMICTAL) 25 MG tablet  Adjustment disorder with mixed anxiety and depressed mood - Plan: lamoTRIgine (LAMICTAL) 25 MG tablet  Add Lamictal 25 mg/d for 2 weeks and then take 1 tab twice daily. Cont Lexapro 10 mg/d, BuSpar 7.5 mg bid, Klonopin prn. Cont w counseling. Counseled on exercise. Try to move up psych appt. Will try to fill out paperwork to the best of my ability. I was initially willing to give some FMLA time and wanted early psychiatry intervention as a contingency. We now have an appointment  on 4/10, after FMLA expires. She will be diligent contacting that team to see if she can be seen sooner in the meanwhile.  F/u in 2 weeks. The patient voiced understanding and agreement to the plan.  I spent 75 total  min w the patient discussing the above plan in addition to filling out a substantial amount of paper work for her and reviewing her chart on the same day of the visit. I spent 23 minutes with her in the clinic, 35 minutes during office hours working on paperwork and reviewing her chart, and 22 minutes after clinic hours continuing to fill out paperwork today.    [name] [place], DO 06/17/21 9:50 AM

## 2021-06-17 NOTE — Patient Instructions (Signed)
Please routinely contact the psychiatry office to see if you can move up your appointment.   Aim to do some physical exertion for 150 minutes per week. This is typically divided into 5 days per week, 30 minutes per day. The activity should be enough to get your heart rate up. Anything is better than nothing if you have time constraints.  Continue with the counseling team.   Let us know if you need anything.

## 2021-06-23 ENCOUNTER — Ambulatory Visit (HOSPITAL_COMMUNITY): Payer: BC Managed Care – PPO | Admitting: Clinical

## 2021-06-24 ENCOUNTER — Other Ambulatory Visit: Payer: Self-pay

## 2021-06-26 ENCOUNTER — Ambulatory Visit (INDEPENDENT_AMBULATORY_CARE_PROVIDER_SITE_OTHER): Payer: BC Managed Care – PPO | Admitting: Clinical

## 2021-06-26 ENCOUNTER — Other Ambulatory Visit: Payer: Self-pay

## 2021-06-26 DIAGNOSIS — Z566 Other physical and mental strain related to work: Secondary | ICD-10-CM | POA: Diagnosis not present

## 2021-06-26 DIAGNOSIS — F419 Anxiety disorder, unspecified: Secondary | ICD-10-CM | POA: Diagnosis not present

## 2021-06-26 DIAGNOSIS — F32A Depression, unspecified: Secondary | ICD-10-CM

## 2021-06-26 NOTE — Progress Notes (Signed)
° °  THERAPIST PROGRESS NOTE  Session Time: 9am  Participation Level: Active  Behavioral Response: AlertAnxious  Type of Therapy: Individual Therapy  Treatment Goals addressed: develop coping skills to manage depression and anxiety sxs as evidenced by being outside in nature/going for walks 2 out of 7 days for up to 30 minutes; daily prayer and meditation.  ProgressTowards Goals: Progressing  Interventions: CBT Virtual Visit via Telephone Note  I connected with Tonya Ramirez on 06/26/21 at  9:00 AM EST by telephone and verified that I am speaking with the correct person using two identifiers.  Location: Patient: home Provider: office   I discussed the limitations, risks, security and privacy concerns of performing an evaluation and management service by telephone and the availability of in person appointments. I also discussed with the patient that there may be a patient responsible charge related to this service. The patient expressed understanding and agreed to proceed.   I discussed the assessment and treatment plan with the patient. The patient was provided an opportunity to ask questions and all were answered. The patient agreed with the plan and demonstrated an understanding of the instructions.   The patient was advised to call back or seek an in-person evaluation if the symptoms worsen or if the condition fails to improve as anticipated.  I provided 40 minutes of non-face-to-face time during this encounter.   Summary: Pt reports PCP prescribed Buspar and Lamictal. Pt says she is responding well to the medication. For Malena Edman pt says she is doing a daily devotion, fasting. Pt says she is planning to participate in grief support group on Monday. Pt discussed putting others first and having to be strong for others. Pt says she has had a hx of prioritizing others needs and neglecting hers.  Suicidal/Homicidal: Pt denies SI/HI but says she has experienced low mood and crying spells.  Denies plan, intent or attempt to harm self or others.  Therapist Response: Discussed stages of grief and grieving process. Processed cognitive distortion(all or nothing thinking, jumping to conclusions, overgeneralization) and challenged pt to put thoughts on trial to challenge her irrational thoughts. Reviewed treatment goal. Pt says she is doing daily devotion and walking 2/7 days per week.  Plan: Return again in 2 weeks.  Diagnosis: anxiety and depression                                     Work related stress  Collaboration of Care: Pt says Dr. Nani Ravens Patient/Guardian was advised Release of Information must be obtained prior to any record release in order to collaborate their care with an outside provider. Patient/Guardian was advised if they have not already done so to contact the registration department to sign all necessary forms in order for [place] to release information regarding their care.   Consent: Patient/Guardian gives verbal consent for treatment and assignment of benefits for services provided during this visit. Patient/Guardian expressed understanding and agreed to proceed.   Yvette Rack, LCSW 06/26/2021

## 2021-06-30 ENCOUNTER — Ambulatory Visit: Payer: BC Managed Care – PPO | Admitting: Family Medicine

## 2021-07-01 ENCOUNTER — Encounter: Payer: Self-pay | Admitting: Family Medicine

## 2021-07-01 ENCOUNTER — Ambulatory Visit
Admission: RE | Admit: 2021-07-01 | Discharge: 2021-07-01 | Disposition: A | Discharge status: Home / Self Care | Payer: BC Managed Care – PPO | Source: Ambulatory Visit | Attending: Family Medicine | Admitting: Family Medicine

## 2021-07-01 ENCOUNTER — Ambulatory Visit: Payer: BC Managed Care – PPO | Admitting: Family Medicine

## 2021-07-01 DIAGNOSIS — Z1231 Encounter for screening mammogram for malignant neoplasm of breast: Secondary | ICD-10-CM | POA: Diagnosis not present

## 2021-07-06 ENCOUNTER — Encounter: Payer: Self-pay | Admitting: Family Medicine

## 2021-07-07 ENCOUNTER — Other Ambulatory Visit (HOSPITAL_BASED_OUTPATIENT_CLINIC_OR_DEPARTMENT_OTHER): Payer: Self-pay

## 2021-07-07 ENCOUNTER — Encounter: Payer: Self-pay | Admitting: Family Medicine

## 2021-07-07 ENCOUNTER — Ambulatory Visit: Payer: BC Managed Care – PPO | Admitting: Family Medicine

## 2021-07-07 ENCOUNTER — Telehealth: Payer: Self-pay | Admitting: Family Medicine

## 2021-07-07 MED FILL — Estradiol Tab 1 MG: ORAL | 30 days supply | Qty: 30 | Fill #9 | Status: CN

## 2021-07-07 NOTE — Telephone Encounter (Signed)
Pt would like CMA to contact her regarding missed appointment  ? ?Please advise ?

## 2021-07-07 NOTE — Telephone Encounter (Signed)
The patient called back to apologize for missing her appointment today. ?She has returned back to work and states they have had her really busy and she just forgot the appointment. ?She has rescheduled her appointment for this coming Friday. ?She will send PCP a mychart message today  ? ?

## 2021-07-07 NOTE — Progress Notes (Signed)
? ?Tonya Ramirez 07-10-1965 [id number] ? ? ?History: Postmenopausal 56 y.o. presents for annual exam. Having trouble sleeping. Reports low back pain x 1 week, does have a history of kidney infections, denies any urinary symptoms.  ? ? ?Gynecologic History ?Postmenopausal ?Last Pap: 2019. Results were: normal. d/c'd due to hyst ?Last mammogram: 07/01/21. Results were: normal ?Last colonoscopy: never ?HRT use: current ? ?Obstetric History ?OB History  ?Gravida Para Term Preterm AB Living  ?2 2       2   ?SAB IAB Ectopic Multiple Live Births  ?           ?  ?# Outcome Date GA Lbr Len/2nd Weight Sex Delivery Anes PTL Lv  ?2 Para           ?1 Para           ? ? ? ?The following portions of the patient's history were reviewed and updated as appropriate: allergies, current medications, past family history, past medical history, past social history, past surgical history, and problem list. ? ?Review of Systems ?Pertinent items noted in HPI and remainder of comprehensive ROS otherwise negative.  ?Past medical history, past surgical history, family history and social history were all reviewed and documented in the EPIC chart. ? ?Exam: ? ?Vitals:  ? 07/08/21 0801  ?BP: 100/62  ?Weight: 98 lb (44.5 kg)  ?Height: 4\' 11"  (1.499 m)  ? ?Body mass index is 19.79 kg/m?. ? ?General appearance:  Normal ?Thyroid:  Symmetrical, normal in size, without palpable masses or nodularity. ?Respiratory ? Auscultation:  Clear without wheezing or rhonchi ?Cardiovascular ? Auscultation:  Regular rate, without rubs, murmurs or gallops ? Edema/varicosities:  Not grossly evident ?Abdominal ? Soft,nontender, without masses, guarding or rebound. ? Liver/spleen:  No organomegaly noted ? Hernia:  None appreciated ? Skin ? Inspection:  Grossly normal ?Breasts: Examined lying and sitting.  ? Right: Without masses, retractions, nipple discharge or axillary adenopathy. ? ? Left: Without masses, retractions, nipple discharge or axillary adenopathy. ?Genitourinary   ? Inguinal/mons:  Normal without inguinal adenopathy ? External genitalia:  Normal appearing vulva with no masses, tenderness, or lesions ? BUS/Urethra/Skene's glands:  Normal ? Vagina:  Normal appearing with normal color and discharge, no lesions.  ? Cervix:  surgically absent ? Uterus:  surgically absent ? Adnexa/parametria:  without masses or tenderness bilaterally ? Anus and perineum: Normal ? Digital rectal exam: Normal sphincter tone without palpated masses or tenderness ?Urine dipstick shows positive for RBC's, positive for nitrates, and positive for leukocytes.  Micro exam: 10-20 WBC's per HPF, 0-2 RBC's per HPF, and many+ bacteria.  ?Patient informed chaperone available to be present for breast and pelvic exam. Patient has requested no chaperone to be present. Patient has been advised what will be completed during breast and pelvic exam.  ? ?Assessment/Plan:   ?1. Acute bilateral low back pain, unspecified whether sciatica present ?UTI, +nitrates ?Macrobid po BID x 7 days  ?- Urinalysis,Complete w/RFL Culture ? ?2. Well woman exam with routine gynecological exam ?Pap d/c'd ? ?3. Insomnia associated with menopause ?May try magnesium glycinate nightly. If no improvement after 6-8 weeks will try adding progesterone nightly ? ?4. Hormone replacement therapy (HRT) ?Continue estradiol ?- estradiol (ESTRACE) 1 MG tablet; TAKE 1 TABLET (1 MG TOTAL) BY MOUTH DAILY.  Dispense: 90 tablet; Refill: 4  ? ?Discussed SBE, colonoscopy and DEXA screening as directed. Recommend 09/07/21 of exercise weekly, including weight bearing exercise. Encouraged the use of seatbelts and sunscreen.  ?Return in 1 year  for annual or sooner prn. ? ?Tanda Rockers WHNP-BC, 8:44 AM 07/08/2021  ?

## 2021-07-08 ENCOUNTER — Ambulatory Visit: Payer: BC Managed Care – PPO | Admitting: Family Medicine

## 2021-07-08 ENCOUNTER — Ambulatory Visit (INDEPENDENT_AMBULATORY_CARE_PROVIDER_SITE_OTHER): Payer: BC Managed Care – PPO | Admitting: Radiology

## 2021-07-08 ENCOUNTER — Encounter: Payer: Self-pay | Admitting: Radiology

## 2021-07-08 ENCOUNTER — Other Ambulatory Visit: Payer: Self-pay

## 2021-07-08 ENCOUNTER — Other Ambulatory Visit (HOSPITAL_BASED_OUTPATIENT_CLINIC_OR_DEPARTMENT_OTHER): Payer: Self-pay

## 2021-07-08 VITALS — BP 100/62 | Ht 59.0 in | Wt 98.0 lb

## 2021-07-08 DIAGNOSIS — Z79890 Hormone replacement therapy (postmenopausal): Secondary | ICD-10-CM | POA: Diagnosis not present

## 2021-07-08 DIAGNOSIS — M545 Low back pain, unspecified: Secondary | ICD-10-CM | POA: Diagnosis not present

## 2021-07-08 DIAGNOSIS — N390 Urinary tract infection, site not specified: Secondary | ICD-10-CM | POA: Diagnosis not present

## 2021-07-08 DIAGNOSIS — R82998 Other abnormal findings in urine: Secondary | ICD-10-CM | POA: Diagnosis not present

## 2021-07-08 DIAGNOSIS — N951 Menopausal and female climacteric states: Secondary | ICD-10-CM

## 2021-07-08 DIAGNOSIS — Z01419 Encounter for gynecological examination (general) (routine) without abnormal findings: Secondary | ICD-10-CM | POA: Diagnosis not present

## 2021-07-08 MED ORDER — NITROFURANTOIN MONOHYD MACRO 100 MG PO CAPS
100.0000 mg | ORAL_CAPSULE | Freq: Two times a day (BID) | ORAL | 0 refills | Status: DC
  Filled 2021-07-08: qty 14, 7d supply, fill #0

## 2021-07-08 MED ORDER — ESTRADIOL 1 MG PO TABS
ORAL_TABLET | Freq: Every day | ORAL | 4 refills | Status: DC
  Filled 2021-07-08: qty 90, 90d supply, fill #0
  Filled 2021-09-30: qty 90, 90d supply, fill #1
  Filled 2022-01-18: qty 30, 30d supply, fill #2
  Filled 2022-02-10: qty 30, 30d supply, fill #3
  Filled 2022-04-09: qty 30, 30d supply, fill #4
  Filled 2022-04-30 – 2022-05-04 (×2): qty 30, 30d supply, fill #5
  Filled 2022-06-01: qty 30, 30d supply, fill #6
  Filled 2022-06-30: qty 30, 30d supply, fill #7

## 2021-07-09 ENCOUNTER — Other Ambulatory Visit (HOSPITAL_BASED_OUTPATIENT_CLINIC_OR_DEPARTMENT_OTHER): Payer: Self-pay

## 2021-07-10 ENCOUNTER — Ambulatory Visit: Payer: BC Managed Care – PPO | Admitting: Family Medicine

## 2021-07-11 LAB — URINALYSIS, COMPLETE W/RFL CULTURE
Bilirubin Urine: NEGATIVE
Casts: NONE SEEN /LPF
Crystals: NONE SEEN /HPF
Glucose, UA: NEGATIVE
Hyaline Cast: NONE SEEN /LPF
Ketones, ur: NEGATIVE
Nitrites, Initial: POSITIVE — AB
Protein, ur: NEGATIVE
Specific Gravity, Urine: 1.02 (ref 1.001–1.035)
Yeast: NONE SEEN /HPF
pH: 6 (ref 5.0–8.0)

## 2021-07-11 LAB — URINE CULTURE
MICRO NUMBER:: 13103833
SPECIMEN QUALITY:: ADEQUATE

## 2021-07-11 LAB — CULTURE INDICATED

## 2021-07-15 ENCOUNTER — Other Ambulatory Visit: Payer: Self-pay | Admitting: Family Medicine

## 2021-07-15 ENCOUNTER — Other Ambulatory Visit (HOSPITAL_BASED_OUTPATIENT_CLINIC_OR_DEPARTMENT_OTHER): Payer: Self-pay

## 2021-07-15 ENCOUNTER — Encounter: Payer: Self-pay | Admitting: Family Medicine

## 2021-07-15 MED ORDER — GABAPENTIN 300 MG PO CAPS
ORAL_CAPSULE | ORAL | 4 refills | Status: DC
  Filled 2021-07-15: qty 120, 30d supply, fill #0
  Filled 2021-08-28: qty 120, 30d supply, fill #1
  Filled 2021-09-30: qty 120, 30d supply, fill #2
  Filled 2021-10-25: qty 120, 30d supply, fill #3
  Filled 2022-01-18: qty 120, 30d supply, fill #4

## 2021-07-16 ENCOUNTER — Other Ambulatory Visit (HOSPITAL_BASED_OUTPATIENT_CLINIC_OR_DEPARTMENT_OTHER): Payer: Self-pay

## 2021-07-21 ENCOUNTER — Ambulatory Visit (HOSPITAL_COMMUNITY): Payer: BC Managed Care – PPO | Admitting: Clinical

## 2021-07-28 ENCOUNTER — Other Ambulatory Visit: Payer: Self-pay

## 2021-07-28 ENCOUNTER — Ambulatory Visit (INDEPENDENT_AMBULATORY_CARE_PROVIDER_SITE_OTHER): Payer: BC Managed Care – PPO | Admitting: Clinical

## 2021-07-28 DIAGNOSIS — F419 Anxiety disorder, unspecified: Secondary | ICD-10-CM

## 2021-07-28 DIAGNOSIS — Z566 Other physical and mental strain related to work: Secondary | ICD-10-CM | POA: Diagnosis not present

## 2021-07-28 DIAGNOSIS — F32A Depression, unspecified: Secondary | ICD-10-CM

## 2021-07-28 NOTE — Progress Notes (Signed)
? ?  THERAPIST PROGRESS NOTE ? ?Session Time: 10am ? ?Participation Level: Active ? ?Behavioral Response: CasualAlertpleasant ? ?Type of Therapy: Individual Therapy ? ?Treatment Goals addressed: Healthy coping methods ? ?ProgressTowards Goals: Progressing ? ?Interventions: Supportive ?Virtual Visit via Video Note ? ?I connected with Council Mechanic on 07/28/21 at 10:00 AM EDT by a video enabled telemedicine application and verified that I am speaking with the correct person using two identifiers. ? ?Location: ?Patient: home ?Provider: office ?  ?I discussed the limitations of evaluation and management by telemedicine and the availability of in person appointments. The patient expressed understanding and agreed to proceed. ?  ?I discussed the assessment and treatment plan with the patient. The patient was provided an opportunity to ask questions and all were answered. The patient agreed with the plan and demonstrated an understanding of the instructions. ?  ?The patient was advised to call back or seek an in-person evaluation if the symptoms worsen or if the condition fails to improve as anticipated. ? ?I provided 50 minutes of non-face-to-face time during this encounter.  ?Summary: Writer informed pt last day of employment will be on 4/21. Pt says she will let writer know at follow up session if she would like to be referred to a new clinician. Pt reports some improvement in her mood. Pt says she is responding well to her medications but continues to endorse racing thoughts and poor sleep pattern. Pt states speaking with her manager about the way she feels she has been treated at work and is hopeful their will be improvement in communication between them. Per pt report she is walking several day during the week, attending a grief support group and attending weekly church service. Writer praised pt for creating a self care routine that she says is helping with improving her mood. Writer discussed with pt grief and  healing process and validated her feelings as she discussed mourning the loss of family members. Additionally pt discussed having a "strained relationship" with her daughter and daughter having minimal contact with her. ?Suicidal/Homicidal: Pt denies SI/HI no plan intent or attempt to harm self or others reported. No psychotic sxs reported. ? ?Plan: Return again in 2 weeks. ? ?Diagnosis: Anxiety and depression ? ?Collaboration of Care: Other none requested ? ?Patient/Guardian was advised Release of Information must be obtained prior to any record release in order to collaborate their care with an outside provider. Patient/Guardian was advised if they have not already done so to contact the registration department to sign all necessary forms in order for [place] to release information regarding their care.  ? ?Consent: Patient/Guardian gives verbal consent for treatment and assignment of benefits for services provided during this visit. Patient/Guardian expressed understanding and agreed to proceed.  ? ?Estefany Goebel Philip Aspen, LCSW ?07/28/2021 ? ?

## 2021-08-10 ENCOUNTER — Telehealth (HOSPITAL_COMMUNITY): Payer: BC Managed Care – PPO | Admitting: Psychiatry

## 2021-08-13 ENCOUNTER — Ambulatory Visit (INDEPENDENT_AMBULATORY_CARE_PROVIDER_SITE_OTHER): Payer: BC Managed Care – PPO | Admitting: Clinical

## 2021-08-13 DIAGNOSIS — F419 Anxiety disorder, unspecified: Secondary | ICD-10-CM

## 2021-08-13 DIAGNOSIS — F32A Depression, unspecified: Secondary | ICD-10-CM | POA: Diagnosis not present
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
## 2021-08-28 ENCOUNTER — Other Ambulatory Visit (HOSPITAL_BASED_OUTPATIENT_CLINIC_OR_DEPARTMENT_OTHER): Payer: Self-pay

## 2021-08-28 ENCOUNTER — Other Ambulatory Visit: Payer: Self-pay | Admitting: Radiology

## 2021-08-28 DIAGNOSIS — R300 Dysuria: Secondary | ICD-10-CM

## 2021-08-28 MED ORDER — NITROFURANTOIN MONOHYD MACRO 100 MG PO CAPS
100.0000 mg | ORAL_CAPSULE | Freq: Two times a day (BID) | ORAL | 0 refills | Status: DC
  Filled 2021-08-28: qty 14, 7d supply, fill #0

## 2021-09-01 ENCOUNTER — Other Ambulatory Visit (HOSPITAL_BASED_OUTPATIENT_CLINIC_OR_DEPARTMENT_OTHER): Payer: Self-pay

## 2021-09-30 ENCOUNTER — Other Ambulatory Visit (HOSPITAL_BASED_OUTPATIENT_CLINIC_OR_DEPARTMENT_OTHER): Payer: Self-pay

## 2021-10-26 ENCOUNTER — Other Ambulatory Visit (HOSPITAL_BASED_OUTPATIENT_CLINIC_OR_DEPARTMENT_OTHER): Payer: Self-pay

## 2021-11-01 DIAGNOSIS — H10023 Other mucopurulent conjunctivitis, bilateral: Secondary | ICD-10-CM | POA: Diagnosis not present

## 2021-11-01 DIAGNOSIS — J060 Acute laryngopharyngitis: Secondary | ICD-10-CM | POA: Diagnosis not present

## 2021-11-01 DIAGNOSIS — J0141 Acute recurrent pansinusitis: Secondary | ICD-10-CM | POA: Diagnosis not present

## 2022-01-18 ENCOUNTER — Other Ambulatory Visit (HOSPITAL_BASED_OUTPATIENT_CLINIC_OR_DEPARTMENT_OTHER): Payer: Self-pay

## 2022-01-27 ENCOUNTER — Other Ambulatory Visit: Payer: Self-pay | Admitting: Nurse Practitioner

## 2022-01-27 ENCOUNTER — Other Ambulatory Visit (HOSPITAL_BASED_OUTPATIENT_CLINIC_OR_DEPARTMENT_OTHER): Payer: Self-pay

## 2022-01-27 DIAGNOSIS — R300 Dysuria: Secondary | ICD-10-CM

## 2022-01-27 MED ORDER — NITROFURANTOIN MONOHYD MACRO 100 MG PO CAPS
100.0000 mg | ORAL_CAPSULE | Freq: Two times a day (BID) | ORAL | 0 refills | Status: DC
  Filled 2022-01-27: qty 14, 7d supply, fill #0

## 2022-02-10 ENCOUNTER — Other Ambulatory Visit (HOSPITAL_BASED_OUTPATIENT_CLINIC_OR_DEPARTMENT_OTHER): Payer: Self-pay

## 2022-02-10 ENCOUNTER — Other Ambulatory Visit: Payer: Self-pay | Admitting: Family Medicine

## 2022-02-11 ENCOUNTER — Other Ambulatory Visit (HOSPITAL_BASED_OUTPATIENT_CLINIC_OR_DEPARTMENT_OTHER): Payer: Self-pay

## 2022-02-13 ENCOUNTER — Other Ambulatory Visit (HOSPITAL_BASED_OUTPATIENT_CLINIC_OR_DEPARTMENT_OTHER): Payer: Self-pay

## 2022-02-13 MED ORDER — GABAPENTIN 300 MG PO CAPS
600.0000 mg | ORAL_CAPSULE | Freq: Two times a day (BID) | ORAL | 0 refills | Status: DC
  Filled 2022-02-13: qty 120, 30d supply, fill #0

## 2022-02-15 ENCOUNTER — Other Ambulatory Visit (HOSPITAL_BASED_OUTPATIENT_CLINIC_OR_DEPARTMENT_OTHER): Payer: Self-pay

## 2022-03-04 ENCOUNTER — Other Ambulatory Visit (HOSPITAL_BASED_OUTPATIENT_CLINIC_OR_DEPARTMENT_OTHER): Payer: Self-pay

## 2022-03-31 ENCOUNTER — Other Ambulatory Visit (HOSPITAL_BASED_OUTPATIENT_CLINIC_OR_DEPARTMENT_OTHER): Payer: Self-pay

## 2022-03-31 MED ORDER — GABAPENTIN 300 MG PO CAPS
600.0000 mg | ORAL_CAPSULE | Freq: Two times a day (BID) | ORAL | 5 refills | Status: DC
  Filled 2022-03-31 – 2022-04-09 (×2): qty 120, 30d supply, fill #0
  Filled 2022-04-30 – 2022-05-04 (×2): qty 120, 30d supply, fill #1
  Filled 2022-06-01: qty 120, 30d supply, fill #2
  Filled 2022-06-30: qty 120, 30d supply, fill #3
  Filled 2022-08-22: qty 120, 30d supply, fill #4
  Filled 2022-09-29 (×2): qty 120, 30d supply, fill #5

## 2022-04-08 ENCOUNTER — Other Ambulatory Visit (HOSPITAL_BASED_OUTPATIENT_CLINIC_OR_DEPARTMENT_OTHER): Payer: Self-pay

## 2022-04-09 ENCOUNTER — Other Ambulatory Visit (HOSPITAL_BASED_OUTPATIENT_CLINIC_OR_DEPARTMENT_OTHER): Payer: Self-pay

## 2022-04-30 ENCOUNTER — Other Ambulatory Visit (HOSPITAL_BASED_OUTPATIENT_CLINIC_OR_DEPARTMENT_OTHER): Payer: Self-pay

## 2022-05-03 IMAGING — MG MM DIGITAL SCREENING BILAT W/ TOMO AND CAD
8 series · 9 of 24 positions shown · non-contrast
Comparison: Previous exam(s).

CLINICAL DATA: Screening.

EXAM:
DIGITAL SCREENING BILATERAL MAMMOGRAM WITH TOMOSYNTHESIS AND CAD
TECHNIQUE: Bilateral screening digital craniocaudal and mediolateral oblique
mammograms were obtained. Bilateral screening digital breast
tomosynthesis was performed. The images were evaluated with
computer-aided detection.

[L CC synth-2D]
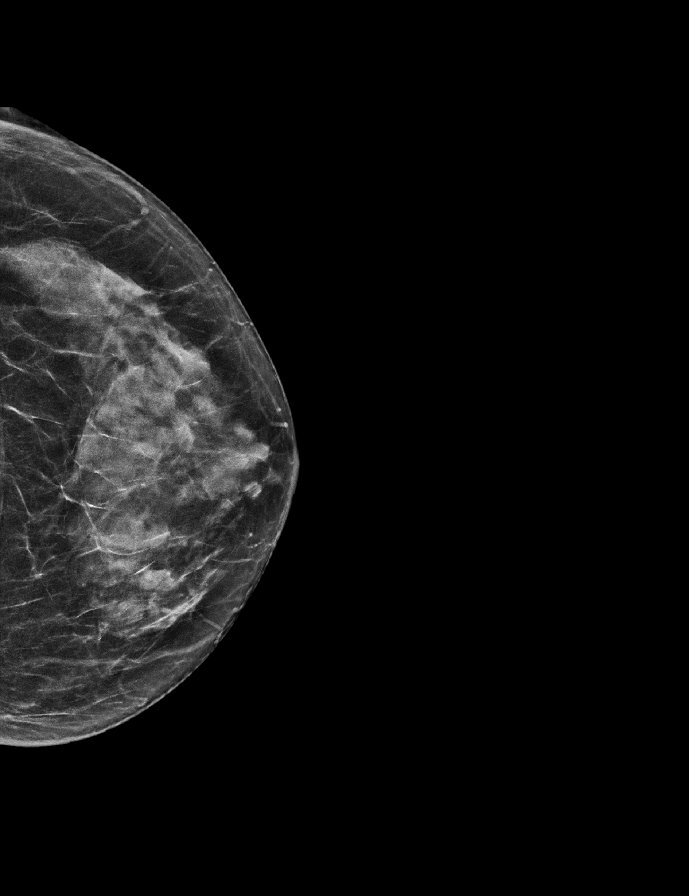

[R CC synth-2D]
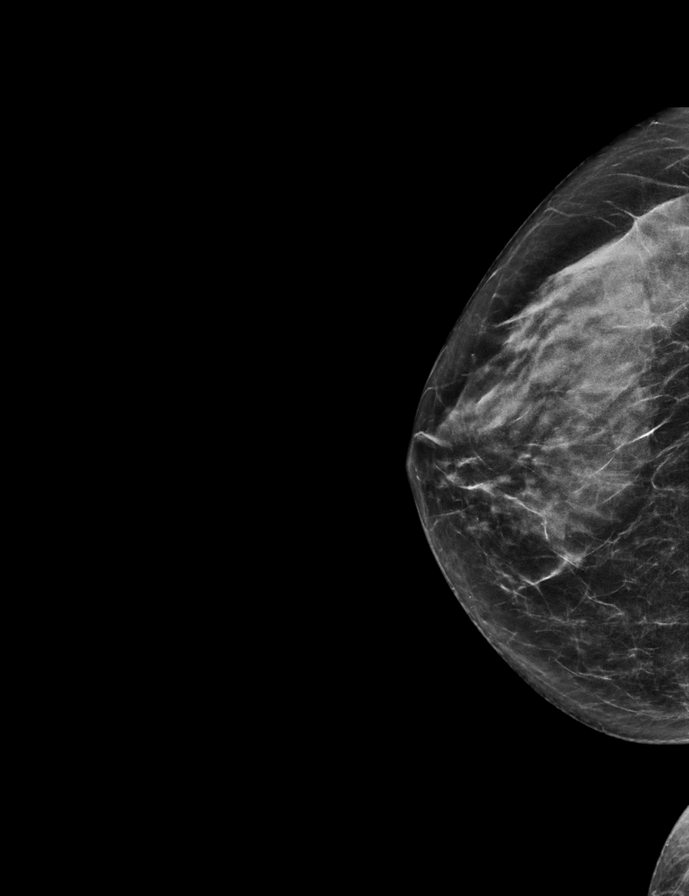

[R MLO synth-2D]
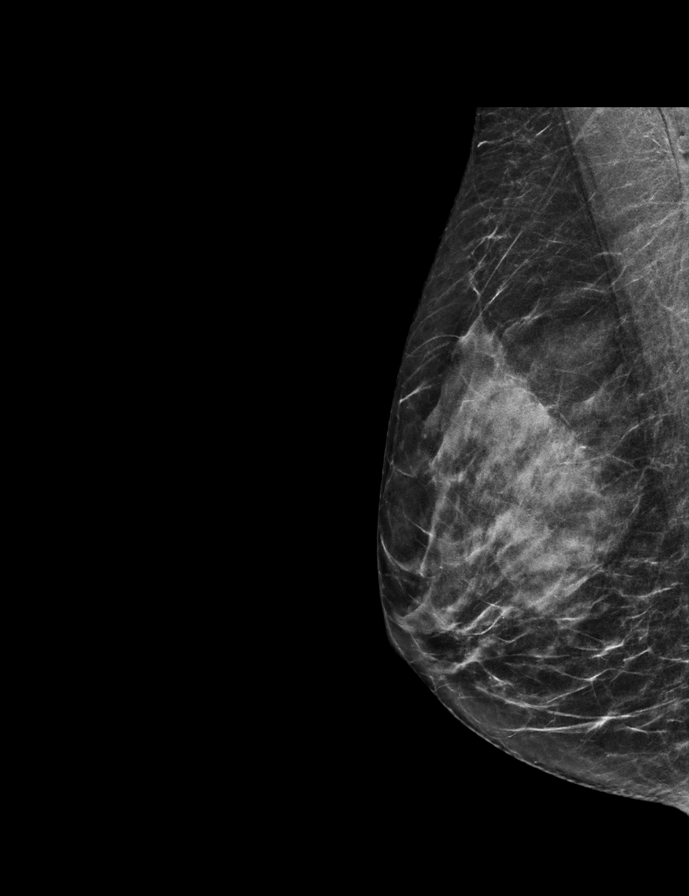

[L MLO synth-2D]
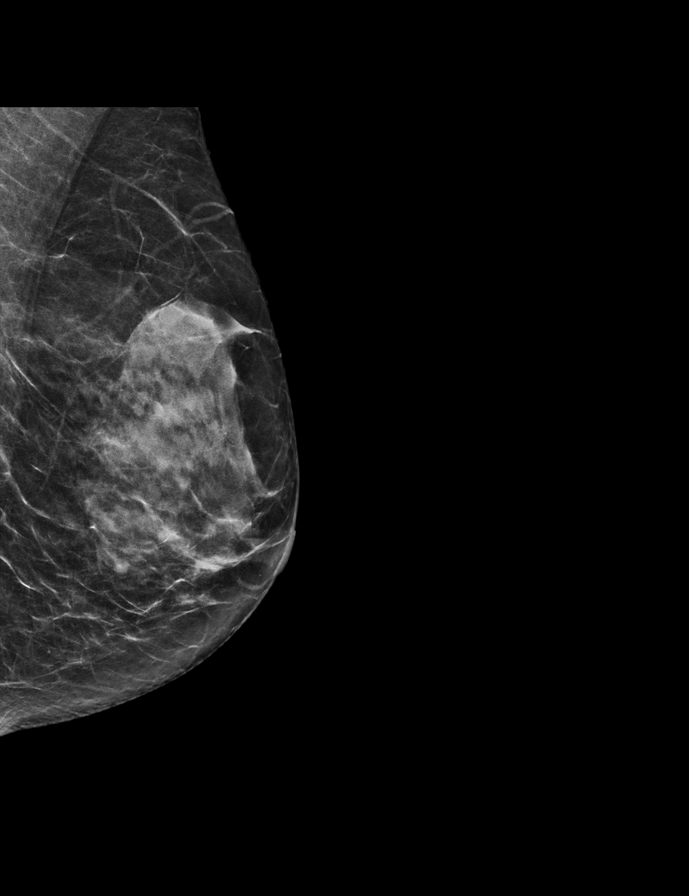

[R MLO tomo · 2 of 50 frames shown]
[frame 17/50]
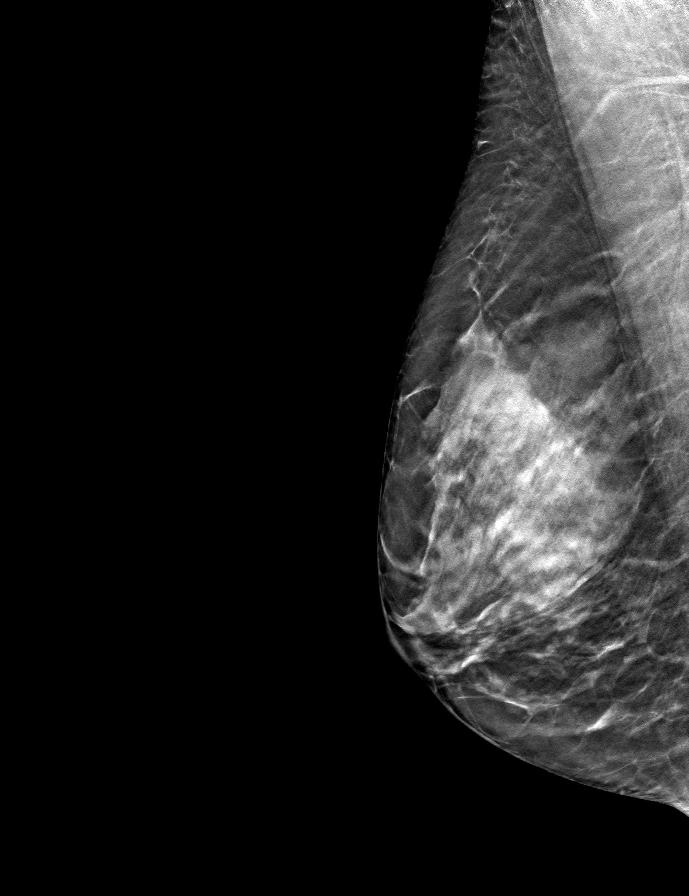
[frame 25/50]
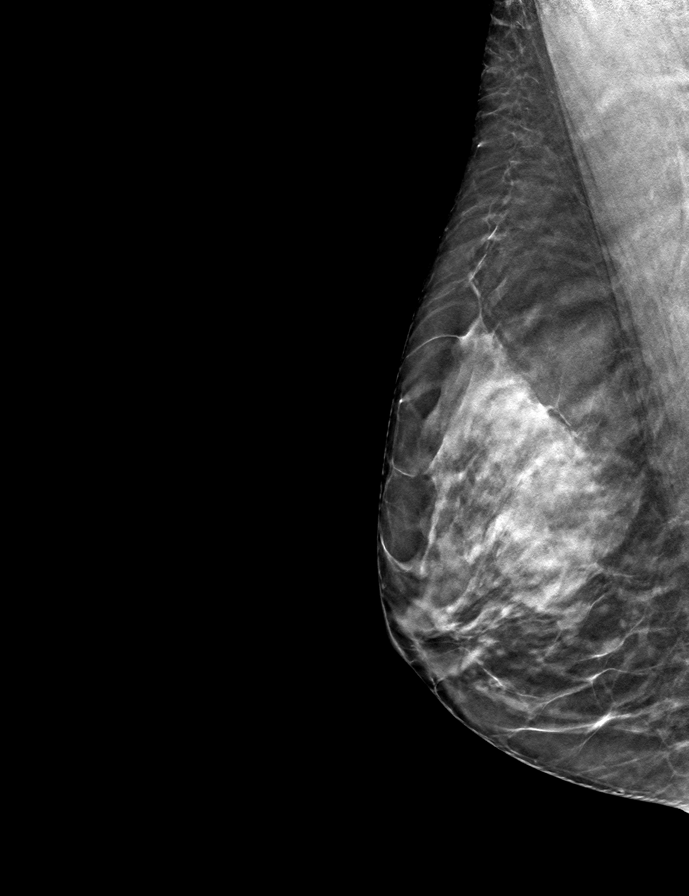

[R CC tomo · tomo slice 25/50.0]
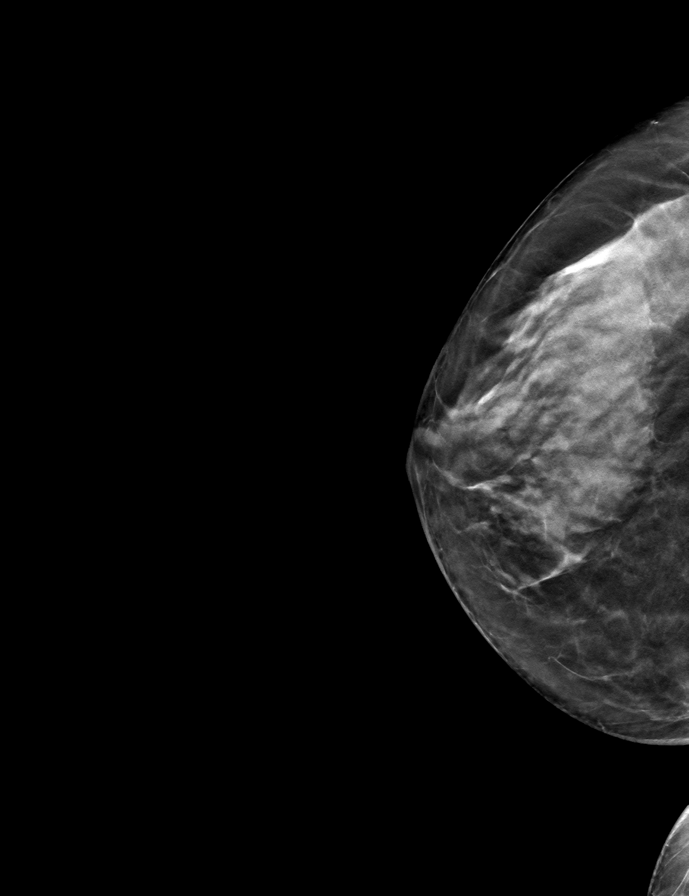

[L CC tomo · tomo slice 27/52.0]
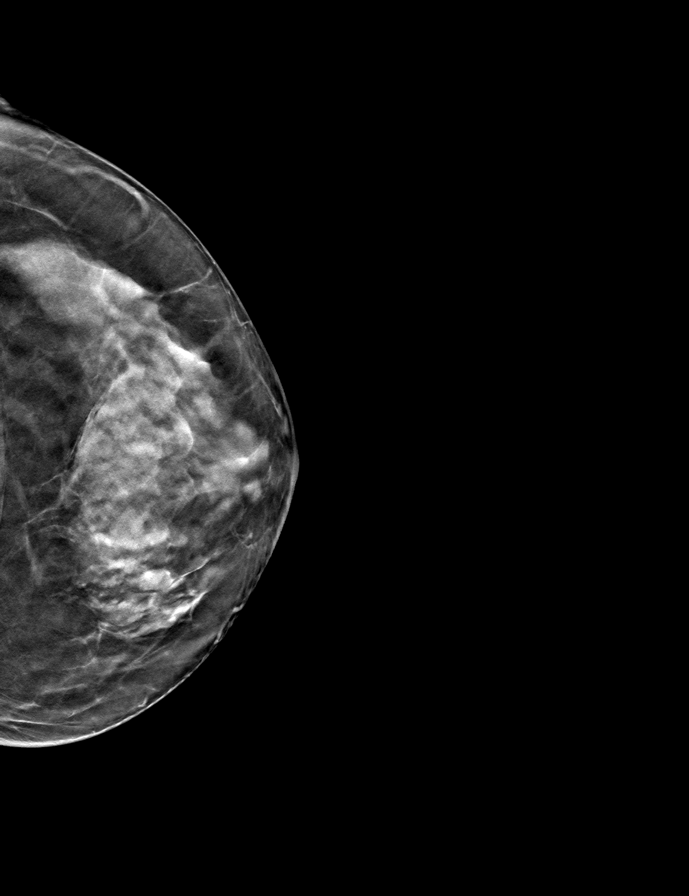

[L MLO tomo · tomo slice 26/51.0]
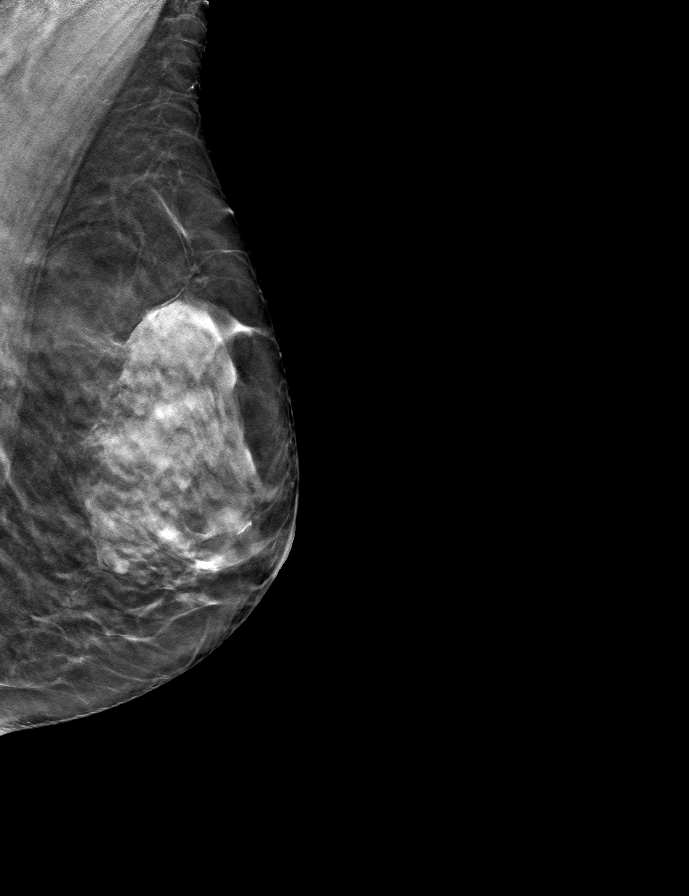

[9 of 24 positions shown; findings below may reference images not displayed]

ACR Breast Density Category d: The breast tissue is extremely dense,
which lowers the sensitivity of mammography
FINDINGS: There are no findings suspicious for malignancy.
IMPRESSION: No mammographic evidence of malignancy. A result letter of this
screening mammogram will be mailed directly to the patient.

RECOMMENDATION:
Screening mammogram in one year. (Code:TA-V-WV9)

BI-RADS CATEGORY  1: Negative.

## 2022-06-01 ENCOUNTER — Other Ambulatory Visit (HOSPITAL_BASED_OUTPATIENT_CLINIC_OR_DEPARTMENT_OTHER): Payer: Self-pay

## 2022-06-07 ENCOUNTER — Other Ambulatory Visit (HOSPITAL_BASED_OUTPATIENT_CLINIC_OR_DEPARTMENT_OTHER): Payer: Self-pay

## 2022-06-30 ENCOUNTER — Other Ambulatory Visit (HOSPITAL_BASED_OUTPATIENT_CLINIC_OR_DEPARTMENT_OTHER): Payer: Self-pay

## 2022-07-15 ENCOUNTER — Other Ambulatory Visit (HOSPITAL_BASED_OUTPATIENT_CLINIC_OR_DEPARTMENT_OTHER): Payer: Self-pay

## 2022-08-04 ENCOUNTER — Ambulatory Visit: Payer: BC Managed Care – PPO | Admitting: Radiology

## 2022-08-12 ENCOUNTER — Ambulatory Visit: Payer: Self-pay | Admitting: Radiology

## 2022-08-18 ENCOUNTER — Ambulatory Visit: Payer: 59 | Admitting: Radiology

## 2022-08-18 VITALS — BP 124/86

## 2022-08-18 DIAGNOSIS — Z79890 Hormone replacement therapy (postmenopausal): Secondary | ICD-10-CM | POA: Diagnosis not present

## 2022-08-18 DIAGNOSIS — Z87898 Personal history of other specified conditions: Secondary | ICD-10-CM

## 2022-08-18 DIAGNOSIS — F419 Anxiety disorder, unspecified: Secondary | ICD-10-CM

## 2022-08-18 DIAGNOSIS — F32A Depression, unspecified: Secondary | ICD-10-CM

## 2022-08-18 DIAGNOSIS — K5900 Constipation, unspecified: Secondary | ICD-10-CM

## 2022-08-18 DIAGNOSIS — L659 Nonscarring hair loss, unspecified: Secondary | ICD-10-CM | POA: Diagnosis not present

## 2022-08-18 LAB — CBC
RBC: 4.83 10*6/uL (ref 3.80–5.10)
WBC: 10.6 10*3/uL (ref 3.8–10.8)

## 2022-08-18 NOTE — Progress Notes (Signed)
   Tonya Ramirez 08/23/1965 [id number]   History:  [name] to discuss hormone therapy. Desires hormone blood work. Concerned she has a chemical imbalance. Taking Estradiol  (since Hysterectomy). Complains of thinning scalp hair, thinning eye lashes, trouble falling asleep, mood swings, anxiety, depression, bruising easily, hard to produce BM's, hard balls, no constipation.   Gynecologic History Hysterectomy  Health Maintenance HRT use: current  Past medical history, past surgical history, family history and social history were all reviewed and documented in the EPIC chart.  ROS:  A ROS was performed and pertinent positives and negatives are included.  Exam:  Vitals:   08/18/22 1546  BP: 124/86   There is no height or weight on file to calculate BMI.  Physical Exam Constitutional:      Appearance: She is normal weight.  Pulmonary:     Effort: Pulmonary effort is normal.  Neurological:     Mental Status: She is alert.  Psychiatric:        Mood and Affect: Mood normal.        Thought Content: Thought content normal.        Judgment: Judgment normal.      Assessment/Plan:   1. Hormone replacement therapy (HRT) May benefit from switching to a transdermal system, will discuss after labs are back  2. Hair thinning - Thyroid Panel With TSH - Estradiol - Testos,Total,Free and SHBG (Female)  3. Anxiety and depression - Vitamin D (25 hydroxy) - Vitamin B12  4. History of bruising easily - CBC - Comp Met (CMET)  5. Constipation, unspecified constipation type Increase fiber   Epifanio Labrador B WHNP-BC 4:08 PM 08/18/2022

## 2022-08-19 ENCOUNTER — Other Ambulatory Visit (HOSPITAL_BASED_OUTPATIENT_CLINIC_OR_DEPARTMENT_OTHER): Payer: Self-pay

## 2022-08-19 LAB — COMPREHENSIVE METABOLIC PANEL
ALT: 14 U/L (ref 6–29)
AST: 21 U/L (ref 10–35)
Albumin: 4.7 g/dL (ref 3.6–5.1)
Calcium: 10.1 mg/dL (ref 8.6–10.4)
Chloride: 103 mmol/L (ref 98–110)
Creat: 0.8 mg/dL (ref 0.50–1.03)
Potassium: 4.6 mmol/L (ref 3.5–5.3)

## 2022-08-19 LAB — CBC
MCH: 28.4 pg (ref 27.0–33.0)
MCHC: 33.4 g/dL (ref 32.0–36.0)
Platelets: 313 10*3/uL (ref 140–400)
RDW: 12.3 % (ref 11.0–15.0)

## 2022-08-19 LAB — VITAMIN B12: Vitamin B-12: 563 pg/mL (ref 200–1100)

## 2022-08-19 NOTE — Telephone Encounter (Signed)
Patient was seen in office on 08/18/22 for OV.  Routing to [place], NP to review and advise.

## 2022-08-22 ENCOUNTER — Other Ambulatory Visit: Payer: Self-pay | Admitting: Radiology

## 2022-08-22 DIAGNOSIS — Z79890 Hormone replacement therapy (postmenopausal): Secondary | ICD-10-CM

## 2022-08-22 LAB — THYROID PANEL WITH TSH
Free Thyroxine Index: 2.9 (ref 1.4–3.8)
T3 Uptake: 25 % (ref 22–35)
T4, Total: 11.6 ug/dL (ref 5.1–11.9)
TSH: 3.62 mIU/L (ref 0.40–4.50)

## 2022-08-22 LAB — COMPREHENSIVE METABOLIC PANEL
AG Ratio: 1.5 (calc) (ref 1.0–2.5)
Alkaline phosphatase (APISO): 84 U/L (ref 37–153)
BUN: 18 mg/dL (ref 7–25)
CO2: 28 mmol/L (ref 20–32)
Globulin: 3.2 g/dL (calc) (ref 1.9–3.7)
Glucose, Bld: 101 mg/dL — ABNORMAL HIGH (ref 65–99)
Sodium: 141 mmol/L (ref 135–146)
Total Bilirubin: 0.4 mg/dL (ref 0.2–1.2)
Total Protein: 7.9 g/dL (ref 6.1–8.1)

## 2022-08-22 LAB — CBC
HCT: 41 % (ref 35.0–45.0)
Hemoglobin: 13.7 g/dL (ref 11.7–15.5)
MCV: 84.9 fL (ref 80.0–100.0)
MPV: 10.9 fL (ref 7.5–12.5)

## 2022-08-22 LAB — TESTOS,TOTAL,FREE AND SHBG (FEMALE)
Free Testosterone: 0.7 pg/mL (ref 0.1–6.4)
Sex Hormone Binding: 178.1 nmol/L — ABNORMAL HIGH (ref 14–73)
Testosterone, Total, LC-MS-MS: 20 ng/dL (ref 2–45)

## 2022-08-22 LAB — ESTRADIOL: Estradiol: 55 pg/mL

## 2022-08-22 LAB — VITAMIN D 25 HYDROXY (VIT D DEFICIENCY, FRACTURES): Vit D, 25-Hydroxy: 37 ng/mL (ref 30–100)

## 2022-08-23 ENCOUNTER — Other Ambulatory Visit (HOSPITAL_BASED_OUTPATIENT_CLINIC_OR_DEPARTMENT_OTHER): Payer: Self-pay

## 2022-08-23 ENCOUNTER — Telehealth: Payer: Self-pay

## 2022-08-23 MED ORDER — ESTRADIOL 0.1 MG/24HR TD PTTW
1.0000 | MEDICATED_PATCH | TRANSDERMAL | 0 refills | Status: DC
  Filled 2022-08-23: qty 8, 28d supply, fill #0
  Filled 2022-10-28: qty 8, 28d supply, fill #1

## 2022-08-23 MED ORDER — PROGESTERONE MICRONIZED 100 MG PO CAPS
100.0000 mg | ORAL_CAPSULE | Freq: Every day | ORAL | 0 refills | Status: DC
  Filled 2022-08-23: qty 30, 30d supply, fill #0
  Filled 2022-09-29 (×2): qty 30, 30d supply, fill #1
  Filled 2022-10-28: qty 30, 30d supply, fill #2

## 2022-08-23 NOTE — Telephone Encounter (Signed)
Medication refill request: estradiol  Last AEX:  07/08/21  Next AEX: none scheduled  Last MMG (if hormonal medication request): 07/01/21  Refill authorized: #30 with no refills

## 2022-08-23 NOTE — Telephone Encounter (Signed)
-----   Message from Tanda Rockers, NP sent at 08/19/2022  9:08 AM EDT ----- No need to repeat CMP fasting, we expect glucose to be up when not fasting.  Estradiol on the low end despite oral treatment daily. I recommend switching to the vivelle dot patch 0.1mg  twice a week ( we discussed this as a possibility yesterday.) 3 month supply  We can add progesterone  nightly to help with sleep.  I recommend nutrafol for her hair thinning.  Vitamin D is on the low end of normal. I recommend she take vitamin D3 2000iu with K2 daily to increase absorption with a meal.   Follow up in 3 months recommended

## 2022-08-23 NOTE — Telephone Encounter (Signed)
Call placed to patient to discuss results. Message left at number on DPR with lab results. Estradiol 0.1 mg patch patient and progesterone   called to patient's pharmacy on file. Patient advised to call and make follow up appointment to check labs in 3 months.

## 2022-08-24 ENCOUNTER — Other Ambulatory Visit (HOSPITAL_BASED_OUTPATIENT_CLINIC_OR_DEPARTMENT_OTHER): Payer: Self-pay

## 2022-08-24 MED ORDER — ESTRADIOL 1 MG PO TABS
1.0000 mg | ORAL_TABLET | Freq: Every day | ORAL | 0 refills | Status: DC
Start: 2022-08-24 — End: 2022-11-17
  Filled 2022-08-24: qty 30, 30d supply, fill #0

## 2022-08-24 NOTE — Telephone Encounter (Signed)
SHGB is high because she is post menopausal and estrogen and testosterone are lowing = increased SHBG bc there is nothing for it to bind to.  No need for thyroid treatment.  LFTs are fine, lipid panel was not drawn, this would be done with her PCP.  If she does not feel better with the treatment we recommended I think she would benefit from a consult with [facility] in [place].

## 2022-08-26 ENCOUNTER — Other Ambulatory Visit: Payer: Self-pay

## 2022-08-26 DIAGNOSIS — E781 Pure hyperglyceridemia: Secondary | ICD-10-CM

## 2022-08-26 NOTE — Telephone Encounter (Signed)
Ok to schedule fasting lipid panel. She should also have a PCP she sees at least once a year.

## 2022-09-01 ENCOUNTER — Other Ambulatory Visit: Payer: 59

## 2022-09-09 ENCOUNTER — Other Ambulatory Visit: Payer: 59

## 2022-09-29 ENCOUNTER — Other Ambulatory Visit (HOSPITAL_BASED_OUTPATIENT_CLINIC_OR_DEPARTMENT_OTHER): Payer: Self-pay

## 2022-10-04 ENCOUNTER — Other Ambulatory Visit: Payer: 59

## 2022-10-07 ENCOUNTER — Other Ambulatory Visit: Payer: 59

## 2022-10-07 DIAGNOSIS — E781 Pure hyperglyceridemia: Secondary | ICD-10-CM | POA: Diagnosis not present

## 2022-10-08 ENCOUNTER — Other Ambulatory Visit (HOSPITAL_BASED_OUTPATIENT_CLINIC_OR_DEPARTMENT_OTHER): Payer: Self-pay

## 2022-10-08 DIAGNOSIS — E785 Hyperlipidemia, unspecified: Secondary | ICD-10-CM

## 2022-10-08 LAB — LIPID PANEL
Cholesterol: 217 mg/dL — ABNORMAL HIGH (ref <200)
HDL: 76 mg/dL (ref >=50)
LDL Cholesterol (Calc): 110 mg/dL (calc) — ABNORMAL HIGH
Non-HDL Cholesterol (Calc): 141 mg/dL (calc) — ABNORMAL HIGH (ref <130)
Total CHOL/HDL Ratio: 2.9 (calc) (ref <5.0)
Triglycerides: 185 mg/dL — ABNORMAL HIGH (ref <150)

## 2022-10-08 MED ORDER — ATORVASTATIN CALCIUM 10 MG PO TABS
10.0000 mg | ORAL_TABLET | Freq: Every day | ORAL | 2 refills | Status: AC
Start: 2022-10-08 — End: ?
  Filled 2022-10-08: qty 30, 30d supply, fill #0
  Filled 2022-11-03: qty 30, 30d supply, fill #1
  Filled 2022-12-28: qty 30, 30d supply, fill #2

## 2022-10-08 NOTE — Telephone Encounter (Signed)
Ok to send lipitor 10mg  to take po daily #30 with 2 refills. Multiple [facility] have appointments available starting the first week in July for a new patient based on the online booking tool.

## 2022-10-08 NOTE — Telephone Encounter (Signed)
Routing to Tonya Ramirez to review 10/07/22 results.

## 2022-10-28 ENCOUNTER — Other Ambulatory Visit (HOSPITAL_BASED_OUTPATIENT_CLINIC_OR_DEPARTMENT_OTHER): Payer: Self-pay

## 2022-11-01 ENCOUNTER — Other Ambulatory Visit (HOSPITAL_BASED_OUTPATIENT_CLINIC_OR_DEPARTMENT_OTHER): Payer: Self-pay

## 2022-11-01 MED ORDER — GABAPENTIN 300 MG PO CAPS
600.0000 mg | ORAL_CAPSULE | Freq: Two times a day (BID) | ORAL | 5 refills | Status: DC
  Filled 2022-11-01: qty 120, 30d supply, fill #0
  Filled 2022-12-02 (×2): qty 120, 30d supply, fill #1
  Filled 2023-02-08: qty 120, 30d supply, fill #2
  Filled 2023-03-18: qty 120, 30d supply, fill #3
  Filled 2023-06-03: qty 120, 30d supply, fill #4
  Filled 2023-08-19: qty 120, 30d supply, fill #5

## 2022-11-03 ENCOUNTER — Other Ambulatory Visit (HOSPITAL_BASED_OUTPATIENT_CLINIC_OR_DEPARTMENT_OTHER): Payer: Self-pay

## 2022-11-17 ENCOUNTER — Other Ambulatory Visit (HOSPITAL_BASED_OUTPATIENT_CLINIC_OR_DEPARTMENT_OTHER): Payer: Self-pay

## 2022-11-17 ENCOUNTER — Ambulatory Visit: Payer: 59 | Admitting: Radiology

## 2022-11-17 ENCOUNTER — Encounter: Payer: Self-pay | Admitting: Radiology

## 2022-11-17 ENCOUNTER — Other Ambulatory Visit (HOSPITAL_COMMUNITY)
Admission: RE | Admit: 2022-11-17 | Discharge: 2022-11-17 | Disposition: A | Discharge status: Home / Self Care | Payer: 59 | Source: Ambulatory Visit | Attending: Radiology | Admitting: Radiology

## 2022-11-17 VITALS — BP 118/82 | HR 68

## 2022-11-17 DIAGNOSIS — E2839 Other primary ovarian failure: Secondary | ICD-10-CM | POA: Diagnosis not present

## 2022-11-17 DIAGNOSIS — Z79890 Hormone replacement therapy (postmenopausal): Secondary | ICD-10-CM | POA: Diagnosis not present

## 2022-11-17 DIAGNOSIS — Z01419 Encounter for gynecological examination (general) (routine) without abnormal findings: Secondary | ICD-10-CM | POA: Insufficient documentation

## 2022-11-17 DIAGNOSIS — E785 Hyperlipidemia, unspecified: Secondary | ICD-10-CM

## 2022-11-17 MED ORDER — ESTRADIOL 0.1 MG/24HR TD PTTW
1.0000 | MEDICATED_PATCH | TRANSDERMAL | 4 refills | Status: DC
Start: 2022-11-18 — End: 2024-01-10
  Filled 2022-11-17: qty 24, 84d supply, fill #0
  Filled 2022-11-18: qty 8, 28d supply, fill #0
  Filled 2022-12-28: qty 24, 84d supply, fill #0
  Filled 2023-03-18: qty 24, 84d supply, fill #1
  Filled 2023-06-14 (×2): qty 24, 84d supply, fill #2
  Filled 2023-10-24: qty 24, 84d supply, fill #3

## 2022-11-17 MED ORDER — PROGESTERONE MICRONIZED 100 MG PO CAPS
100.0000 mg | ORAL_CAPSULE | Freq: Every day | ORAL | 4 refills | Status: DC
Start: 2022-11-17 — End: 2024-01-10
  Filled 2022-11-17 – 2022-12-02 (×2): qty 90, 90d supply, fill #0
  Filled 2023-03-18: qty 90, 90d supply, fill #1
  Filled 2023-06-14 (×2): qty 90, 90d supply, fill #2
  Filled 2023-10-24: qty 90, 90d supply, fill #3

## 2022-11-17 NOTE — Progress Notes (Signed)
   Tonya Ramirez [birth year] [id number]   History: Postmenopausal 57 y.o. presents for annual exam. S/p TAH/BSO for endometriosis. Doing well on HRT. Has not found PCP yet, needs to schedule to manage hyperlipidemia, in the meantime doing well on lipitor.   Gynecologic History Postmenopausal Last Pap: 2019. Results were: normal Last mammogram: 07/01/21. Results were: normal   Obstetric History OB History  Gravida Para Term Preterm AB Living  2 2       2   SAB IAB Ectopic Multiple Live Births               # Outcome Date GA Lbr Len/2nd Weight Sex Type Anes PTL Lv  2 Para           1 Para              The following portions of the patient's history were reviewed and updated as appropriate: allergies, current medications, past family history, past medical history, past social history, past surgical history, and problem list.  Review of Systems Pertinent items noted in HPI and remainder of comprehensive ROS otherwise negative.  Past medical history, past surgical history, family history and social history were all reviewed and documented in the EPIC chart.  Exam:  Vitals:   11/17/22 1450  BP: 118/82  Pulse: 68  SpO2: 98%   There is no height or weight on file to calculate BMI.  General appearance:  Normal Thyroid:  Symmetrical, normal in size, without palpable masses or nodularity. Respiratory  Auscultation:  Clear without wheezing or rhonchi Cardiovascular  Auscultation:  Regular rate, without rubs, murmurs or gallops  Edema/varicosities:  Not grossly evident Abdominal  Soft,nontender, without masses, guarding or rebound.  Liver/spleen:  No organomegaly noted  Hernia:  None appreciated  Skin  Inspection:  Grossly normal Breasts: Examined lying and sitting.   Right: Without masses, retractions, nipple discharge or axillary adenopathy.   Left: Without masses, retractions, nipple discharge or axillary adenopathy. Genitourinary   Inguinal/mons:  Normal without inguinal  adenopathy  External genitalia:  Normal appearing vulva with no masses, tenderness, or lesions  BUS/Urethra/Skene's glands:  Normal  Vagina:  Normal appearing with normal color and discharge, no lesions.   Cervix:  absent  Uterus:  absent  Adnexa/parametria:     Absent bilaterally  Anus and perineum: Normal    Raynelle Fanning, CMA present for exam  Assessment/Plan:   1. Well woman exam with routine gynecological exam - Cytology - PAP( [facility]) - Schedule mammogram  2. Hormone replacement therapy (HRT) - estradiol (VIVELLE-DOT) 0.1 MG/24HR patch; Place 1 patch (0.1 mg total) onto the skin 2 (two) times a week.  Dispense: 24 patch; Refill: 4 - progesterone (PROMETRIUM) 100 MG capsule; Take 1 capsule (100 mg total) by mouth daily.  Dispense: 90 capsule; Refill: 4  3. Hyperlipidemia, unspecified hyperlipidemia type - Lipid Profile  4. Estrogen deficiency - DG Bone Density; Future    Discussed SBE, colonoscopy and DEXA screening as directed. Recommend of exercise weekly, including weight bearing exercise. Encouraged the use of seatbelts and sunscreen.  Return in 1 year for annual or sooner prn.  Sama Arauz B WHNP-BC, 3:12 PM 11/17/2022

## 2022-11-18 ENCOUNTER — Other Ambulatory Visit: Payer: Self-pay

## 2022-11-18 ENCOUNTER — Other Ambulatory Visit (HOSPITAL_BASED_OUTPATIENT_CLINIC_OR_DEPARTMENT_OTHER): Payer: Self-pay

## 2022-11-18 LAB — CYTOLOGY - PAP
Comment: NEGATIVE
Diagnosis: NEGATIVE
High risk HPV: NEGATIVE

## 2022-11-29 ENCOUNTER — Other Ambulatory Visit (HOSPITAL_BASED_OUTPATIENT_CLINIC_OR_DEPARTMENT_OTHER): Payer: Self-pay

## 2022-11-30 NOTE — Telephone Encounter (Signed)
Routing for final review and closing.

## 2022-12-02 ENCOUNTER — Other Ambulatory Visit (HOSPITAL_COMMUNITY): Payer: Self-pay

## 2022-12-02 ENCOUNTER — Other Ambulatory Visit: Payer: Self-pay

## 2022-12-02 ENCOUNTER — Other Ambulatory Visit (HOSPITAL_BASED_OUTPATIENT_CLINIC_OR_DEPARTMENT_OTHER): Payer: Self-pay

## 2022-12-03 ENCOUNTER — Other Ambulatory Visit (HOSPITAL_COMMUNITY): Payer: Self-pay

## 2022-12-20 ENCOUNTER — Other Ambulatory Visit (HOSPITAL_BASED_OUTPATIENT_CLINIC_OR_DEPARTMENT_OTHER): Payer: Self-pay

## 2022-12-28 ENCOUNTER — Other Ambulatory Visit (HOSPITAL_BASED_OUTPATIENT_CLINIC_OR_DEPARTMENT_OTHER): Payer: Self-pay

## 2023-02-08 ENCOUNTER — Other Ambulatory Visit (HOSPITAL_BASED_OUTPATIENT_CLINIC_OR_DEPARTMENT_OTHER): Payer: Self-pay

## 2023-03-02 ENCOUNTER — Other Ambulatory Visit (HOSPITAL_BASED_OUTPATIENT_CLINIC_OR_DEPARTMENT_OTHER): Payer: Self-pay

## 2023-03-14 ENCOUNTER — Other Ambulatory Visit (HOSPITAL_BASED_OUTPATIENT_CLINIC_OR_DEPARTMENT_OTHER): Payer: Self-pay

## 2023-03-18 ENCOUNTER — Other Ambulatory Visit (HOSPITAL_BASED_OUTPATIENT_CLINIC_OR_DEPARTMENT_OTHER): Payer: Self-pay

## 2023-04-08 ENCOUNTER — Other Ambulatory Visit (HOSPITAL_BASED_OUTPATIENT_CLINIC_OR_DEPARTMENT_OTHER): Payer: Self-pay

## 2023-04-15 ENCOUNTER — Other Ambulatory Visit (HOSPITAL_BASED_OUTPATIENT_CLINIC_OR_DEPARTMENT_OTHER): Payer: Self-pay

## 2023-04-15 DIAGNOSIS — J019 Acute sinusitis, unspecified: Secondary | ICD-10-CM | POA: Diagnosis not present

## 2023-04-15 DIAGNOSIS — B9689 Other specified bacterial agents as the cause of diseases classified elsewhere: Secondary | ICD-10-CM | POA: Diagnosis not present

## 2023-04-15 MED ORDER — AMOXICILLIN 875 MG PO TABS
875.0000 mg | ORAL_TABLET | Freq: Two times a day (BID) | ORAL | 0 refills | Status: AC
  Filled 2023-04-15: qty 20, 10d supply, fill #0

## 2023-04-15 MED ORDER — PROMETHAZINE-DM 6.25-15 MG/5ML PO SYRP
5.0000 mL | ORAL_SOLUTION | ORAL | 0 refills | Status: AC
  Filled 2023-04-15: qty 120, 4d supply, fill #0

## 2023-06-03 ENCOUNTER — Other Ambulatory Visit (HOSPITAL_BASED_OUTPATIENT_CLINIC_OR_DEPARTMENT_OTHER): Payer: Self-pay

## 2023-06-14 ENCOUNTER — Other Ambulatory Visit (HOSPITAL_BASED_OUTPATIENT_CLINIC_OR_DEPARTMENT_OTHER): Payer: Self-pay

## 2023-06-15 ENCOUNTER — Other Ambulatory Visit (HOSPITAL_BASED_OUTPATIENT_CLINIC_OR_DEPARTMENT_OTHER): Payer: Self-pay

## 2023-06-21 ENCOUNTER — Other Ambulatory Visit (HOSPITAL_BASED_OUTPATIENT_CLINIC_OR_DEPARTMENT_OTHER): Payer: Self-pay

## 2023-08-19 ENCOUNTER — Other Ambulatory Visit (HOSPITAL_BASED_OUTPATIENT_CLINIC_OR_DEPARTMENT_OTHER): Payer: Self-pay

## 2023-10-11 ENCOUNTER — Other Ambulatory Visit (HOSPITAL_BASED_OUTPATIENT_CLINIC_OR_DEPARTMENT_OTHER): Payer: Self-pay

## 2023-10-11 DIAGNOSIS — H1132 Conjunctival hemorrhage, left eye: Secondary | ICD-10-CM | POA: Diagnosis not present

## 2023-10-11 DIAGNOSIS — H2513 Age-related nuclear cataract, bilateral: Secondary | ICD-10-CM | POA: Diagnosis not present

## 2023-10-11 DIAGNOSIS — H0288B Meibomian gland dysfunction left eye, upper and lower eyelids: Secondary | ICD-10-CM | POA: Diagnosis not present

## 2023-10-11 DIAGNOSIS — H0288A Meibomian gland dysfunction right eye, upper and lower eyelids: Secondary | ICD-10-CM | POA: Diagnosis not present

## 2023-10-11 MED ORDER — OFLOXACIN 0.3 % OP SOLN
OPHTHALMIC | 0 refills | Status: AC
  Filled 2023-10-11: qty 5, 7d supply, fill #0

## 2023-10-17 ENCOUNTER — Other Ambulatory Visit (HOSPITAL_BASED_OUTPATIENT_CLINIC_OR_DEPARTMENT_OTHER): Payer: Self-pay

## 2023-10-17 ENCOUNTER — Other Ambulatory Visit: Payer: Self-pay | Admitting: Neurology

## 2023-10-17 DIAGNOSIS — G939 Disorder of brain, unspecified: Secondary | ICD-10-CM

## 2023-10-17 MED ORDER — GABAPENTIN 300 MG PO CAPS
600.0000 mg | ORAL_CAPSULE | Freq: Two times a day (BID) | ORAL | 5 refills | Status: AC
  Filled 2023-10-17: qty 120, 30d supply, fill #0
  Filled 2024-01-10 (×2): qty 120, 30d supply, fill #1
  Filled 2024-04-02: qty 120, 30d supply, fill #2
  Filled 2024-05-21: qty 120, 30d supply, fill #3

## 2023-10-24 ENCOUNTER — Other Ambulatory Visit (HOSPITAL_BASED_OUTPATIENT_CLINIC_OR_DEPARTMENT_OTHER): Payer: Self-pay

## 2023-10-25 ENCOUNTER — Ambulatory Visit
Admission: RE | Admit: 2023-10-25 | Discharge: 2023-10-25 | Disposition: A | Discharge status: Home / Self Care | Source: Ambulatory Visit | Attending: Neurology | Admitting: Neurology

## 2023-10-25 DIAGNOSIS — M79601 Pain in right arm: Secondary | ICD-10-CM | POA: Diagnosis not present

## 2023-10-25 DIAGNOSIS — E781 Pure hyperglyceridemia: Secondary | ICD-10-CM | POA: Diagnosis not present

## 2023-10-25 DIAGNOSIS — M5481 Occipital neuralgia: Secondary | ICD-10-CM | POA: Diagnosis not present

## 2023-10-25 DIAGNOSIS — M542 Cervicalgia: Secondary | ICD-10-CM | POA: Diagnosis not present

## 2023-10-25 DIAGNOSIS — G939 Disorder of brain, unspecified: Secondary | ICD-10-CM | POA: Diagnosis not present

## 2023-10-25 DIAGNOSIS — I10 Essential (primary) hypertension: Secondary | ICD-10-CM | POA: Diagnosis not present

## 2023-10-27 DIAGNOSIS — M79601 Pain in right arm: Secondary | ICD-10-CM | POA: Diagnosis not present

## 2023-10-27 DIAGNOSIS — M5481 Occipital neuralgia: Secondary | ICD-10-CM | POA: Diagnosis not present

## 2023-10-27 DIAGNOSIS — M542 Cervicalgia: Secondary | ICD-10-CM | POA: Diagnosis not present

## 2023-10-31 ENCOUNTER — Ambulatory Visit: Admitting: Radiology

## 2023-11-01 NOTE — Telephone Encounter (Signed)
 Routing to [facility] appts to assist with rescheduling.

## 2023-11-08 DIAGNOSIS — M79601 Pain in right arm: Secondary | ICD-10-CM | POA: Diagnosis not present

## 2023-11-08 DIAGNOSIS — M542 Cervicalgia: Secondary | ICD-10-CM | POA: Diagnosis not present

## 2023-11-08 DIAGNOSIS — M5481 Occipital neuralgia: Secondary | ICD-10-CM | POA: Diagnosis not present

## 2023-11-09 DIAGNOSIS — M542 Cervicalgia: Secondary | ICD-10-CM | POA: Diagnosis not present

## 2023-11-09 DIAGNOSIS — M5481 Occipital neuralgia: Secondary | ICD-10-CM | POA: Diagnosis not present

## 2023-11-09 DIAGNOSIS — M79601 Pain in right arm: Secondary | ICD-10-CM | POA: Diagnosis not present

## 2023-11-18 DIAGNOSIS — M79601 Pain in right arm: Secondary | ICD-10-CM | POA: Diagnosis not present

## 2023-11-18 DIAGNOSIS — M542 Cervicalgia: Secondary | ICD-10-CM | POA: Diagnosis not present

## 2023-11-18 DIAGNOSIS — M5481 Occipital neuralgia: Secondary | ICD-10-CM | POA: Diagnosis not present

## 2023-11-21 DIAGNOSIS — M79601 Pain in right arm: Secondary | ICD-10-CM | POA: Diagnosis not present

## 2023-11-21 DIAGNOSIS — M5481 Occipital neuralgia: Secondary | ICD-10-CM | POA: Diagnosis not present

## 2023-11-21 DIAGNOSIS — M542 Cervicalgia: Secondary | ICD-10-CM | POA: Diagnosis not present

## 2023-11-22 DIAGNOSIS — M79601 Pain in right arm: Secondary | ICD-10-CM | POA: Diagnosis not present

## 2023-11-22 DIAGNOSIS — M5481 Occipital neuralgia: Secondary | ICD-10-CM | POA: Diagnosis not present

## 2023-11-22 DIAGNOSIS — M542 Cervicalgia: Secondary | ICD-10-CM | POA: Diagnosis not present

## 2023-11-28 DIAGNOSIS — M79601 Pain in right arm: Secondary | ICD-10-CM | POA: Diagnosis not present

## 2023-11-28 DIAGNOSIS — M542 Cervicalgia: Secondary | ICD-10-CM | POA: Diagnosis not present

## 2023-11-28 DIAGNOSIS — M5481 Occipital neuralgia: Secondary | ICD-10-CM | POA: Diagnosis not present

## 2023-12-05 DIAGNOSIS — M5481 Occipital neuralgia: Secondary | ICD-10-CM | POA: Diagnosis not present

## 2023-12-05 DIAGNOSIS — M542 Cervicalgia: Secondary | ICD-10-CM | POA: Diagnosis not present

## 2023-12-05 DIAGNOSIS — M79601 Pain in right arm: Secondary | ICD-10-CM | POA: Diagnosis not present

## 2023-12-07 DIAGNOSIS — M5481 Occipital neuralgia: Secondary | ICD-10-CM | POA: Diagnosis not present

## 2023-12-07 DIAGNOSIS — M542 Cervicalgia: Secondary | ICD-10-CM | POA: Diagnosis not present

## 2023-12-07 DIAGNOSIS — M79601 Pain in right arm: Secondary | ICD-10-CM | POA: Diagnosis not present

## 2023-12-08 ENCOUNTER — Encounter: Payer: Self-pay | Admitting: Radiology

## 2023-12-08 ENCOUNTER — Other Ambulatory Visit (HOSPITAL_COMMUNITY): Payer: Self-pay

## 2023-12-08 ENCOUNTER — Ambulatory Visit (INDEPENDENT_AMBULATORY_CARE_PROVIDER_SITE_OTHER): Payer: Self-pay | Admitting: Radiology

## 2023-12-08 VITALS — BP 120/64 | HR 57 | Ht 58.5 in | Wt 106.0 lb

## 2023-12-08 DIAGNOSIS — Z8639 Personal history of other endocrine, nutritional and metabolic disease: Secondary | ICD-10-CM

## 2023-12-08 DIAGNOSIS — Z01419 Encounter for gynecological examination (general) (routine) without abnormal findings: Secondary | ICD-10-CM | POA: Diagnosis not present

## 2023-12-08 DIAGNOSIS — E2839 Other primary ovarian failure: Secondary | ICD-10-CM | POA: Diagnosis not present

## 2023-12-08 DIAGNOSIS — E785 Hyperlipidemia, unspecified: Secondary | ICD-10-CM

## 2023-12-08 DIAGNOSIS — R635 Abnormal weight gain: Secondary | ICD-10-CM | POA: Diagnosis not present

## 2023-12-08 DIAGNOSIS — Z1331 Encounter for screening for depression: Secondary | ICD-10-CM

## 2023-12-08 DIAGNOSIS — Z79890 Hormone replacement therapy (postmenopausal): Secondary | ICD-10-CM | POA: Diagnosis not present

## 2023-12-08 NOTE — Progress Notes (Addendum)
   Tonya Ramirez [birth year] [id number]   History: Postmenopausal 58 y.o. presents for annual exam. S/p TAH/BSO for endometriosis. Doing well on HRT. Would like labs today. C/o weight gain. Has not found PCP yet, needs to schedule to manage hyperlipidemia, in the meantime doing well on lipitor.   Gynecologic History Postmenopausal Last Pap: 2024. Results were: normal Last mammogram: 07/01/21. Results were: normal DEXA: ordered last year but did not have done   Obstetric History OB History  Gravida Para Term Preterm AB Living  2 2    2   SAB IAB Ectopic Multiple Live Births          # Outcome Date GA Lbr Len/2nd Weight Sex Type Anes PTL Lv  2 Para           1 Para              The following portions of the patient's history were reviewed and updated as appropriate: allergies, current medications, past family history, past medical history, past social history, past surgical history, and problem list.  Review of Systems Pertinent items noted in HPI and remainder of comprehensive ROS otherwise negative.  Past medical history, past surgical history, family history and social history were all reviewed and documented in the EPIC chart.  Exam:  Vitals:   12/08/23 0758  BP: 120/64  Pulse: (!) 57  SpO2: 98%  Weight: 106 lb (48.1 kg)  Height: 4' 10.5 (1.486 m)   Body mass index is 21.78 kg/m.  General appearance:  Normal Thyroid :  Symmetrical, normal in size, without palpable masses or nodularity. Respiratory  Auscultation:  Clear without wheezing or rhonchi Cardiovascular  Auscultation:  Regular rate, without rubs, murmurs or gallops  Edema/varicosities:  Not grossly evident Abdominal  Soft,nontender, without masses, guarding or rebound.  Liver/spleen:  No organomegaly noted  Hernia:  None appreciated  Skin  Inspection:  Grossly normal Breasts: Examined lying and sitting.   Right: Without masses, retractions, nipple discharge or axillary adenopathy.   Left: Without  masses, retractions, nipple discharge or axillary adenopathy. Genitourinary   Inguinal/mons:  Normal without inguinal adenopathy  External genitalia:  Normal appearing vulva with no masses, tenderness, or lesions  BUS/Urethra/Skene's glands:  Normal  Vagina:  Normal appearing with normal color and discharge, no lesions.   Cervix:  absent  Uterus:  absent  Adnexa/parametria:     Absent bilaterally  Anus and perineum: Normal    Darice Hoit, CMA present for exam  Assessment/Plan:   1. Well woman exam with routine gynecological exam (Primary)  2. Hyperlipidemia, unspecified hyperlipidemia type - Lipid Profile - CBC - Comp Met (CMET)  3. Weight gain - CBC - Comp Met (CMET) - Thyroid  Panel With TSH  4. History of vitamin D  deficiency - Vitamin D  (25 hydroxy)  5. Hormone replacement therapy (HRT) - Estradiol  - Testos,Total,Free and SHBG (Female)  6. Estrogen deficiency - DG Bone Density; Future   7. Depression screen Positive, stressful relationship   Return in 1 year for annual or sooner prn.  Demaurion Dicioccio B WHNP-BC, 8:36 AM 12/08/2023

## 2023-12-09 ENCOUNTER — Ambulatory Visit: Payer: Self-pay | Admitting: Radiology

## 2023-12-09 DIAGNOSIS — R7989 Other specified abnormal findings of blood chemistry: Secondary | ICD-10-CM

## 2023-12-13 LAB — CBC
HCT: 39.6 % (ref 35.0–45.0)
Hemoglobin: 12.9 g/dL (ref 11.7–15.5)
MCH: 28.8 pg (ref 27.0–33.0)
MCHC: 32.6 g/dL (ref 32.0–36.0)
MCV: 88.4 fL (ref 80.0–100.0)
MPV: 10.8 fL (ref 7.5–12.5)
Platelets: 289 Thousand/uL (ref 140–400)
RBC: 4.48 Million/uL (ref 3.80–5.10)
RDW: 12.2 % (ref 11.0–15.0)
WBC: 6.6 Thousand/uL (ref 3.8–10.8)

## 2023-12-13 LAB — COMPREHENSIVE METABOLIC PANEL WITH GFR
AG Ratio: 1.8 (calc) (ref 1.0–2.5)
ALT: 11 U/L (ref 6–29)
AST: 17 U/L (ref 10–35)
Albumin: 4.4 g/dL (ref 3.6–5.1)
Alkaline phosphatase (APISO): 69 U/L (ref 37–153)
BUN: 19 mg/dL (ref 7–25)
CO2: 29 mmol/L (ref 20–32)
Calcium: 9.2 mg/dL (ref 8.6–10.4)
Chloride: 105 mmol/L (ref 98–110)
Creat: 1.03 mg/dL (ref 0.50–1.03)
Globulin: 2.4 g/dL (ref 1.9–3.7)
Glucose, Bld: 87 mg/dL (ref 65–99)
Potassium: 4 mmol/L (ref 3.5–5.3)
Sodium: 140 mmol/L (ref 135–146)
Total Bilirubin: 0.6 mg/dL (ref 0.2–1.2)
Total Protein: 6.8 g/dL (ref 6.1–8.1)
eGFR: 63 mL/min/1.73m2 (ref >=60)

## 2023-12-13 LAB — TESTOS,TOTAL,FREE AND SHBG (FEMALE)
Free Testosterone: 1.5 pg/mL (ref 0.1–6.4)
Sex Hormone Binding: 61 nmol/L (ref 14–73)
Testosterone, Total, LC-MS-MS: 14 ng/dL (ref 2–45)

## 2023-12-13 LAB — LIPID PANEL
Cholesterol: 197 mg/dL (ref <200)
HDL: 61 mg/dL (ref >=50)
LDL Cholesterol (Calc): 109 mg/dL — ABNORMAL HIGH
Non-HDL Cholesterol (Calc): 136 mg/dL — ABNORMAL HIGH (ref <130)
Total CHOL/HDL Ratio: 3.2 (calc) (ref <5.0)
Triglycerides: 159 mg/dL — ABNORMAL HIGH (ref <150)

## 2023-12-13 LAB — ESTRADIOL: Estradiol: 58 pg/mL

## 2023-12-13 LAB — THYROID PANEL WITH TSH
Free Thyroxine Index: 2.6 (ref 1.4–3.8)
T3 Uptake: 31 % (ref 22–35)
T4, Total: 8.4 ug/dL (ref 5.1–11.9)
TSH: 3.66 m[IU]/L (ref 0.40–4.50)

## 2023-12-13 LAB — VITAMIN D 25 HYDROXY (VIT D DEFICIENCY, FRACTURES): Vit D, 25-Hydroxy: 95 ng/mL (ref 30–100)

## 2023-12-20 ENCOUNTER — Other Ambulatory Visit: Payer: Self-pay | Admitting: Radiology

## 2023-12-20 DIAGNOSIS — Z79890 Hormone replacement therapy (postmenopausal): Secondary | ICD-10-CM

## 2023-12-20 MED ORDER — NONFORMULARY OR COMPOUNDED ITEM: 0 refills | Status: AC

## 2023-12-20 NOTE — Telephone Encounter (Signed)
 Please call in Testosterone PLO gel 4% 30 grams pea sized amount nightly to [facility], please notify patient she will need to pick it up when ready. Needs lab only testosterone level in 3 months.

## 2023-12-20 NOTE — Telephone Encounter (Signed)
 Compounded testosterone 4% PLO gel called in to [facility].  Patient needs testosterone labs in 3 months.

## 2023-12-23 DIAGNOSIS — M79601 Pain in right arm: Secondary | ICD-10-CM | POA: Diagnosis not present

## 2023-12-23 DIAGNOSIS — M5481 Occipital neuralgia: Secondary | ICD-10-CM | POA: Diagnosis not present

## 2023-12-23 DIAGNOSIS — M542 Cervicalgia: Secondary | ICD-10-CM | POA: Diagnosis not present

## 2023-12-26 DIAGNOSIS — M5481 Occipital neuralgia: Secondary | ICD-10-CM | POA: Diagnosis not present

## 2023-12-26 DIAGNOSIS — M79601 Pain in right arm: Secondary | ICD-10-CM | POA: Diagnosis not present

## 2023-12-26 DIAGNOSIS — M542 Cervicalgia: Secondary | ICD-10-CM | POA: Diagnosis not present

## 2024-01-10 ENCOUNTER — Other Ambulatory Visit: Payer: Self-pay | Admitting: Radiology

## 2024-01-10 ENCOUNTER — Other Ambulatory Visit (HOSPITAL_BASED_OUTPATIENT_CLINIC_OR_DEPARTMENT_OTHER): Payer: Self-pay

## 2024-01-10 DIAGNOSIS — Z79890 Hormone replacement therapy (postmenopausal): Secondary | ICD-10-CM

## 2024-01-10 DIAGNOSIS — M810 Age-related osteoporosis without current pathological fracture: Secondary | ICD-10-CM | POA: Diagnosis not present

## 2024-01-10 DIAGNOSIS — Z1231 Encounter for screening mammogram for malignant neoplasm of breast: Secondary | ICD-10-CM | POA: Diagnosis not present

## 2024-01-10 LAB — HM MAMMOGRAPHY

## 2024-01-10 LAB — HM DEXA SCAN

## 2024-01-10 NOTE — Telephone Encounter (Signed)
 Med refill request: estradiol  (Vivelle -Dot) 0.1 mg/24 hr patch Start date: 11/18/22 Dispense #24 patch with 1 of 4 remaining Last AEX: 12/08/23 Next AEX: 12/12/24 Last MMG (if hormonal med): 07/01/21 Refill authorized? Please Advise.

## 2024-01-11 ENCOUNTER — Other Ambulatory Visit (HOSPITAL_BASED_OUTPATIENT_CLINIC_OR_DEPARTMENT_OTHER): Payer: Self-pay

## 2024-01-11 ENCOUNTER — Other Ambulatory Visit: Payer: Self-pay

## 2024-01-11 MED ORDER — ESTRADIOL 0.1 MG/24HR TD PTTW
1.0000 | MEDICATED_PATCH | TRANSDERMAL | 4 refills | Status: AC
  Filled 2024-01-11: qty 24, 84d supply, fill #0
  Filled 2024-04-04: qty 24, 84d supply, fill #1

## 2024-01-11 NOTE — Telephone Encounter (Signed)
 Please disregard

## 2024-01-12 ENCOUNTER — Other Ambulatory Visit (HOSPITAL_BASED_OUTPATIENT_CLINIC_OR_DEPARTMENT_OTHER): Payer: Self-pay

## 2024-01-12 NOTE — Telephone Encounter (Addendum)
 Med refill request: estradiol  (Vivelle -Dot) 0.1 mg/24 hr patch Dispense #24 patch with 1 of 4 remaining Last AEX: 12/08/23 Next AEX: 12/12/24 Last MMG (if hormonal med): 07/01/21 Refill authorized?                   *Please Advise.

## 2024-01-13 ENCOUNTER — Ambulatory Visit: Payer: Self-pay | Admitting: Radiology

## 2024-01-13 ENCOUNTER — Encounter: Payer: Self-pay | Admitting: Radiology

## 2024-01-13 ENCOUNTER — Other Ambulatory Visit (HOSPITAL_BASED_OUTPATIENT_CLINIC_OR_DEPARTMENT_OTHER): Payer: Self-pay

## 2024-01-13 MED ORDER — PROGESTERONE MICRONIZED 100 MG PO CAPS
100.0000 mg | ORAL_CAPSULE | Freq: Every day | ORAL | 4 refills | Status: AC
  Filled 2024-01-13 – 2024-01-25 (×2): qty 90, 90d supply, fill #0
  Filled 2024-04-19: qty 90, 90d supply, fill #1

## 2024-01-13 NOTE — Telephone Encounter (Signed)
 Med refill request: Progesterone   Last AEX: 12/08/23 Next AEX: 12/12/24 Last MMG (if hormonal med) 07/01/21 Birads cat 1 neg  Refill authorized: last rx 11/17/22 #90 with 4 refills. Please approve or deny

## 2024-01-16 ENCOUNTER — Encounter: Payer: Self-pay | Admitting: Radiology

## 2024-01-25 ENCOUNTER — Ambulatory Visit: Payer: Self-pay | Admitting: Radiology

## 2024-01-25 ENCOUNTER — Other Ambulatory Visit (HOSPITAL_BASED_OUTPATIENT_CLINIC_OR_DEPARTMENT_OTHER): Payer: Self-pay

## 2024-01-25 VITALS — BP 102/68 | Wt 105.0 lb

## 2024-01-25 DIAGNOSIS — M810 Age-related osteoporosis without current pathological fracture: Secondary | ICD-10-CM

## 2024-01-26 ENCOUNTER — Encounter: Payer: Self-pay | Admitting: Radiology

## 2024-01-26 ENCOUNTER — Ambulatory Visit: Payer: Self-pay | Admitting: Radiology

## 2024-01-26 LAB — COMPREHENSIVE METABOLIC PANEL WITH GFR
AG Ratio: 1.9 (calc) (ref 1.0–2.5)
ALT: 11 U/L (ref 6–29)
AST: 18 U/L (ref 10–35)
Albumin: 4.7 g/dL (ref 3.6–5.1)
Alkaline phosphatase (APISO): 70 U/L (ref 37–153)
BUN/Creatinine Ratio: 16 (calc) (ref 6–22)
BUN: 18 mg/dL (ref 7–25)
CO2: 27 mmol/L (ref 20–32)
Calcium: 9.7 mg/dL (ref 8.6–10.4)
Chloride: 103 mmol/L (ref 98–110)
Creat: 1.12 mg/dL — ABNORMAL HIGH (ref 0.50–1.03)
Globulin: 2.5 g/dL (ref 1.9–3.7)
Glucose, Bld: 90 mg/dL (ref 65–99)
Potassium: 4.3 mmol/L (ref 3.5–5.3)
Sodium: 139 mmol/L (ref 135–146)
Total Bilirubin: 0.9 mg/dL (ref 0.2–1.2)
Total Protein: 7.2 g/dL (ref 6.1–8.1)
eGFR: 57 mL/min/1.73m2 — ABNORMAL LOW (ref >=60)

## 2024-01-26 LAB — PARATHYROID HORMONE, INTACT (NO CA): PTH: 26 pg/mL (ref 16–77)

## 2024-01-26 NOTE — Telephone Encounter (Signed)
 Levels are not worrisome. Likely from dehydration, they were normal last month.

## 2024-01-26 NOTE — Progress Notes (Signed)
   Tonya Ramirez 07-May-1965 [id number]   History: Postmenopausal 58 y.o. presents to discuss management of osteoporosis. Works out at [employer] and with a Psychologist, educational multiple times a week, strength and cardio. On HRT.   Gynecologic History Postmenopausal/ s/p hyst DEXA: Spine T score -1, Right femur neck -2.9, left femur neck -3.0 HRT use: current  Obstetric History OB History  Gravida Para Term Preterm AB Living  2 2    2   SAB IAB Ectopic Multiple Live Births          # Outcome Date GA Lbr Len/2nd Weight Sex Type Anes PTL Lv  2 Para           1 Para               The following portions of the patient's history were reviewed and updated as appropriate: allergies, current medications, past family history, past medical history, past social history, past surgical history, and problem list.  Review of Systems Pertinent items noted in HPI and remainder of comprehensive ROS otherwise negative.  Past medical history, past surgical history, family history and social history were all reviewed and documented in the EPIC chart.  Exam:  Vitals:   01/25/24 1103  BP: 102/68  Weight: 105 lb (47.6 kg)   Body mass index is 21.57 kg/m. Physical Exam Constitutional:      Appearance: Normal appearance.  Neurological:     Mental Status: She is alert.  Psychiatric:        Mood and Affect: Mood normal.        Thought Content: Thought content normal.        Judgment: Judgment normal.      Assessment/Plan:   1. Age-related osteoporosis without current pathological fracture (Primary) - PTH, intact (no Ca) - Comprehensive metabolic panel with GFR Normal labs Will precert Evenity to build bone.  [name] B WHNP-BC, 8:59 AM 01/26/2024

## 2024-01-27 ENCOUNTER — Other Ambulatory Visit (HOSPITAL_BASED_OUTPATIENT_CLINIC_OR_DEPARTMENT_OTHER): Payer: Self-pay

## 2024-02-08 ENCOUNTER — Other Ambulatory Visit: Payer: Self-pay | Admitting: *Deleted

## 2024-02-08 DIAGNOSIS — M810 Age-related osteoporosis without current pathological fracture: Secondary | ICD-10-CM

## 2024-02-08 MED ORDER — ROMOSOZUMAB-AQQG 105 MG/1.17ML ~~LOC~~ SOSY
210.0000 mg | PREFILLED_SYRINGE | Freq: Once | SUBCUTANEOUS | Status: AC
  Administered 2024-03-12: 210 mg via SUBCUTANEOUS

## 2024-02-20 NOTE — Telephone Encounter (Signed)
 Per review of [employer], plan requires PA.

## 2024-02-23 NOTE — Telephone Encounter (Signed)
 See 02/08/24 medication referral.   Encounter closed.

## 2024-02-28 NOTE — Telephone Encounter (Signed)
 Please appeal patient has a T score of -3.0 and high risk for fractures. Very petite and active, risk for falls running/hiking and working with trainer.

## 2024-02-28 NOTE — Telephone Encounter (Signed)
 I am happy to do the peer to peer this week.

## 2024-02-28 NOTE — Telephone Encounter (Signed)
 Referral updated. PA response scanned into EPIC.   Routing to Jami and Emily

## 2024-02-28 NOTE — Telephone Encounter (Signed)
 Pt is asking that you give her call back about the PA.

## 2024-02-28 NOTE — Telephone Encounter (Signed)
 Spoke with patient, advised per Jami regarding Evenity appeal.   Patient is requesting provider to reconsider peer to peer review to avoid further delay. Advised I will review request with provider and f/u. Patient is agreeable and appreciative of call.   Jami -please advise. If you can proceed with peer to peer I can contact [employer] to determine peer to peer process in advance.

## 2024-02-29 NOTE — Telephone Encounter (Signed)
 See medication referral.

## 2024-02-29 NOTE — Telephone Encounter (Signed)
 Call placed to provider Peer to Peer review line at [phone], left detailed message requesting to schedule Peer-to-Peer for [place], [place] # [id number]. Provider will be out of office week of 03/05/24, requesting to schedule this week. Return call to [place] at [phone].

## 2024-03-06 NOTE — Telephone Encounter (Signed)
 Routing FYI.

## 2024-03-07 ENCOUNTER — Other Ambulatory Visit: Payer: Self-pay | Admitting: *Deleted

## 2024-03-07 DIAGNOSIS — M810 Age-related osteoporosis without current pathological fracture: Secondary | ICD-10-CM

## 2024-03-07 MED ORDER — ROMOSOZUMAB-AQQG 105 MG/1.17ML ~~LOC~~ SOSY
210.0000 mg | PREFILLED_SYRINGE | SUBCUTANEOUS | Status: AC
  Administered 2024-04-12: 210 mg via SUBCUTANEOUS

## 2024-03-07 NOTE — Telephone Encounter (Signed)
 See Evenity  referral.

## 2024-03-09 ENCOUNTER — Other Ambulatory Visit (HOSPITAL_COMMUNITY): Payer: Self-pay

## 2024-03-09 ENCOUNTER — Telehealth: Payer: Self-pay

## 2024-03-09 NOTE — Telephone Encounter (Signed)
 Tonya Ramirez

## 2024-03-09 NOTE — Telephone Encounter (Signed)
 MEDICAL PA APPROVED

## 2024-03-09 NOTE — Telephone Encounter (Signed)
 Buy/Bill (Office supplied medication)  Out-of-pocket cost due at time of clinic visit: $255  Number of injection/visits approved: 12  Primary: [employer] Co-insurance: 10% Admin fee co-insurance: 10%  Secondary: --- Co-insurance:  Admin fee co-insurance:   Medical Benefit Details: Date Benefits were checked: 02/10/24 Deductible: NO/ Coinsurance: 10%/ Admin Fee: 10%  Prior Auth: APPROVED PA#  Expiration Date: 02/22/24-02/20/25  # of doses approved: 12 -----------------------------------------------------------------------  Patient IS eligible for Copay Card. Copay Card can make patient's cost as little as $25. Link to apply: https://www.amgensupportplus.[email or web address]/copay  ** This summary of benefits is an estimation of the patient's out-of-pocket cost. Exact cost may very based on individual plan coverage.

## 2024-03-12 ENCOUNTER — Ambulatory Visit (INDEPENDENT_AMBULATORY_CARE_PROVIDER_SITE_OTHER)

## 2024-03-12 ENCOUNTER — Other Ambulatory Visit

## 2024-03-12 DIAGNOSIS — Z79890 Hormone replacement therapy (postmenopausal): Secondary | ICD-10-CM | POA: Diagnosis not present

## 2024-03-12 DIAGNOSIS — L659 Nonscarring hair loss, unspecified: Secondary | ICD-10-CM | POA: Diagnosis not present

## 2024-03-12 DIAGNOSIS — I10 Essential (primary) hypertension: Secondary | ICD-10-CM | POA: Diagnosis not present

## 2024-03-12 DIAGNOSIS — E785 Hyperlipidemia, unspecified: Secondary | ICD-10-CM | POA: Diagnosis not present

## 2024-03-12 DIAGNOSIS — M810 Age-related osteoporosis without current pathological fracture: Secondary | ICD-10-CM | POA: Diagnosis not present

## 2024-03-12 DIAGNOSIS — E281 Androgen excess: Secondary | ICD-10-CM | POA: Diagnosis not present

## 2024-03-13 ENCOUNTER — Other Ambulatory Visit

## 2024-03-15 NOTE — Telephone Encounter (Signed)
 See Evenity  referral.   Encounter closed.

## 2024-03-16 ENCOUNTER — Ambulatory Visit: Payer: Self-pay | Admitting: Radiology

## 2024-03-16 DIAGNOSIS — R7989 Other specified abnormal findings of blood chemistry: Secondary | ICD-10-CM

## 2024-03-16 LAB — TESTOS,TOTAL,FREE AND SHBG (FEMALE)
Free Testosterone: 43.9 pg/mL — ABNORMAL HIGH (ref 0.1–6.4)
Sex Hormone Binding: 41 nmol/L (ref 14–73)
Testosterone, Total, LC-MS-MS: 287 ng/dL — ABNORMAL HIGH (ref 2–45)

## 2024-03-16 MED ORDER — NONFORMULARY OR COMPOUNDED ITEM: 5 refills | Status: AC

## 2024-03-16 NOTE — Telephone Encounter (Signed)
 Message was sent to Tonya Ramirez to adjust her RX already. She will be in contact with her.

## 2024-03-16 NOTE — Telephone Encounter (Signed)
 Spoke to patient, notified patient of testosterone results and recommendations.  RX for Compounded Testosterone called in to [facility], with new directions to apply every other night.

## 2024-03-27 NOTE — Telephone Encounter (Signed)
 Recheck testosterone levels 6-8 weeks after changing testosterone to every other night.

## 2024-04-04 ENCOUNTER — Other Ambulatory Visit (HOSPITAL_BASED_OUTPATIENT_CLINIC_OR_DEPARTMENT_OTHER): Payer: Self-pay

## 2024-04-12 ENCOUNTER — Ambulatory Visit (INDEPENDENT_AMBULATORY_CARE_PROVIDER_SITE_OTHER)

## 2024-04-12 ENCOUNTER — Other Ambulatory Visit: Payer: Self-pay | Admitting: Nurse Practitioner

## 2024-04-12 ENCOUNTER — Telehealth: Payer: Self-pay

## 2024-04-12 DIAGNOSIS — H02729 Madarosis of unspecified eye, unspecified eyelid and periocular area: Secondary | ICD-10-CM

## 2024-04-12 DIAGNOSIS — M810 Age-related osteoporosis without current pathological fracture: Secondary | ICD-10-CM | POA: Diagnosis not present

## 2024-04-12 NOTE — Telephone Encounter (Signed)
 Patient complains of loss of eye lashes. Patient is asking if Jami will prescribe Latisse or if she needs to see Dermatologist (if so, she requests a referral).  Chart sent to provider for review/recommendations.

## 2024-04-12 NOTE — Telephone Encounter (Signed)
 This request has already been addressed in another message. Encounter closed.

## 2024-04-12 NOTE — Telephone Encounter (Signed)
Referral sent to derm

## 2024-04-12 NOTE — Progress Notes (Signed)
 Evenity  injections given SQ left arm and SQ right arm.  Patient tolerated injections well.  Annual exam: 12/08/23 JC   Calcium :    9.5        Date: 01/25/24 GFR: 57                  Vitamin D : 95          Date: 12/08/23  Upcoming dental procedures: no   Hx of Kidney Disease: no   Hx of heart attack or stroke in the last year: no  Last Bone Density Scan: 9/9/2

## 2024-04-12 NOTE — Telephone Encounter (Signed)
 Patient notified a dermatology referral had been placed for her.

## 2024-04-16 NOTE — Telephone Encounter (Signed)
 This is not something we prescribe, as it is not in our scope of practice. Her PCP or dermatologist should be able to.

## 2024-04-19 ENCOUNTER — Other Ambulatory Visit (HOSPITAL_BASED_OUTPATIENT_CLINIC_OR_DEPARTMENT_OTHER): Payer: Self-pay

## 2024-04-19 ENCOUNTER — Other Ambulatory Visit: Payer: Self-pay

## 2024-04-19 DIAGNOSIS — M5412 Radiculopathy, cervical region: Secondary | ICD-10-CM | POA: Diagnosis not present

## 2024-04-19 DIAGNOSIS — L659 Nonscarring hair loss, unspecified: Secondary | ICD-10-CM | POA: Diagnosis not present

## 2024-04-19 DIAGNOSIS — F411 Generalized anxiety disorder: Secondary | ICD-10-CM | POA: Diagnosis not present

## 2024-04-19 DIAGNOSIS — I10 Essential (primary) hypertension: Secondary | ICD-10-CM | POA: Diagnosis not present

## 2024-04-19 MED ORDER — SERTRALINE HCL 25 MG PO TABS
ORAL_TABLET | ORAL | 0 refills | Status: DC
  Filled 2024-04-19: qty 8, 7d supply, fill #0
  Filled 2024-04-19: qty 86, 44d supply, fill #0
  Filled 2024-04-19: qty 90, 48d supply, fill #0
  Filled 2024-04-19: qty 4, 4d supply, fill #0

## 2024-04-19 MED ORDER — BIMATOPROST 0.03 % EX SOLN
1.0000 [drp] | Freq: Every day | CUTANEOUS | 3 refills | Status: AC
  Filled 2024-04-19 – 2024-04-20 (×3): qty 5, 30d supply, fill #0
  Filled 2024-05-21: qty 5, 30d supply, fill #1

## 2024-04-20 ENCOUNTER — Other Ambulatory Visit (HOSPITAL_BASED_OUTPATIENT_CLINIC_OR_DEPARTMENT_OTHER): Payer: Self-pay

## 2024-04-20 MED ORDER — AMLODIPINE BESYLATE 5 MG PO TABS
5.0000 mg | ORAL_TABLET | Freq: Every day | ORAL | 0 refills | Status: DC
  Filled 2024-04-20: qty 30, 30d supply, fill #0

## 2024-05-09 ENCOUNTER — Other Ambulatory Visit (HOSPITAL_BASED_OUTPATIENT_CLINIC_OR_DEPARTMENT_OTHER): Payer: Self-pay

## 2024-05-09 ENCOUNTER — Other Ambulatory Visit: Payer: Self-pay | Admitting: *Deleted

## 2024-05-09 MED ORDER — ROMOSOZUMAB-AQQG 105 MG/1.17ML ~~LOC~~ SOSY
210.0000 mg | PREFILLED_SYRINGE | SUBCUTANEOUS | Status: AC
  Administered 2024-05-14: 210 mg via SUBCUTANEOUS

## 2024-05-09 MED ORDER — AMLODIPINE BESYLATE 5 MG PO TABS
5.0000 mg | ORAL_TABLET | Freq: Every day | ORAL | 1 refills | Status: AC
  Filled 2024-05-09 – 2024-05-21 (×2): qty 90, 90d supply, fill #0

## 2024-05-11 ENCOUNTER — Other Ambulatory Visit (HOSPITAL_COMMUNITY): Payer: Self-pay

## 2024-05-11 ENCOUNTER — Other Ambulatory Visit (HOSPITAL_BASED_OUTPATIENT_CLINIC_OR_DEPARTMENT_OTHER): Payer: Self-pay

## 2024-05-11 ENCOUNTER — Telehealth: Payer: Self-pay

## 2024-05-11 NOTE — Telephone Encounter (Addendum)
 Evenity  VOB initiated via [email or web address]  Last OV:  Next OV:  Last Evenity  inj: 04/12/24 Next Evenity  inj DUE: 05/13/24

## 2024-05-14 ENCOUNTER — Ambulatory Visit (INDEPENDENT_AMBULATORY_CARE_PROVIDER_SITE_OTHER)

## 2024-05-14 ENCOUNTER — Other Ambulatory Visit (HOSPITAL_COMMUNITY): Payer: Self-pay

## 2024-05-14 DIAGNOSIS — M810 Age-related osteoporosis without current pathological fracture: Secondary | ICD-10-CM | POA: Diagnosis not present

## 2024-05-14 NOTE — Progress Notes (Signed)
 3rd Evenity  given 05/14/24 - One injection in each arm (IC, CCMA)

## 2024-05-15 ENCOUNTER — Other Ambulatory Visit (HOSPITAL_BASED_OUTPATIENT_CLINIC_OR_DEPARTMENT_OTHER): Payer: Self-pay

## 2024-05-17 ENCOUNTER — Other Ambulatory Visit (HOSPITAL_COMMUNITY): Payer: Self-pay

## 2024-05-17 NOTE — Telephone Encounter (Signed)
 Buy/Bill (Office supplied medication)  Out-of-pocket cost due at time of clinic visit: $255 + DEDUCTIBLE  Number of injection/visits approved: 12  Primary: [employer] [place] Co-insurance: 10% Admin fee co-insurance: 10%  Secondary: --- Co-insurance:  Admin fee co-insurance:   Medical Benefit Details: Date Benefits were checked: 05/17/24 Deductible: $0 Met of $700 Required/ Coinsurance: 10%/ Admin Fee: 10%  Prior Auth: APPROVED PA# [id number] Expiration Date: 02/22/24-02/20/25  # of doses approved: 2 -----------------------------------------------------------------------  Patient IS eligible for Copay Card. Copay Card can make patient's cost as little as $25. Link to apply: [email or web address]://[email or web address]  ** This summary of benefits is an estimation of the patient's out-of-pocket cost. Exact cost may very based on individual plan coverage.

## 2024-05-17 NOTE — Telephone Encounter (Signed)
 SABRA

## 2024-05-17 NOTE — Telephone Encounter (Signed)
 See referral

## 2024-05-21 ENCOUNTER — Other Ambulatory Visit: Payer: Self-pay

## 2024-05-21 ENCOUNTER — Other Ambulatory Visit (HOSPITAL_BASED_OUTPATIENT_CLINIC_OR_DEPARTMENT_OTHER): Payer: Self-pay

## 2024-05-24 ENCOUNTER — Other Ambulatory Visit (HOSPITAL_BASED_OUTPATIENT_CLINIC_OR_DEPARTMENT_OTHER): Payer: Self-pay

## 2024-05-24 MED ORDER — SERTRALINE HCL 50 MG PO TABS
50.0000 mg | ORAL_TABLET | Freq: Every day | ORAL | 1 refills | Status: AC
  Filled 2024-05-24: qty 90, 90d supply, fill #0

## 2024-05-28 ENCOUNTER — Other Ambulatory Visit (HOSPITAL_BASED_OUTPATIENT_CLINIC_OR_DEPARTMENT_OTHER): Payer: Self-pay

## 2024-05-28 ENCOUNTER — Encounter (HOSPITAL_BASED_OUTPATIENT_CLINIC_OR_DEPARTMENT_OTHER): Payer: Self-pay

## 2024-06-15 ENCOUNTER — Ambulatory Visit

## 2024-08-30 ENCOUNTER — Ambulatory Visit: Admitting: Dermatology

## 2024-12-12 ENCOUNTER — Ambulatory Visit: Admitting: Radiology
